# Patient Record
Sex: Male | Born: 1965 | Race: Black or African American | Hispanic: No | Smoking: Former smoker
Health system: Southern US, Community
[De-identification: ages and names within clinical notes are randomized; demographics above are authoritative.]

## PROBLEM LIST (undated history)

## (undated) DIAGNOSIS — F329 Major depressive disorder, single episode, unspecified: Secondary | ICD-10-CM

## (undated) DIAGNOSIS — I2721 Secondary pulmonary arterial hypertension: Secondary | ICD-10-CM

## (undated) DIAGNOSIS — F32A Depression, unspecified: Secondary | ICD-10-CM

## (undated) DIAGNOSIS — I2510 Atherosclerotic heart disease of native coronary artery without angina pectoris: Secondary | ICD-10-CM

## (undated) DIAGNOSIS — J439 Emphysema, unspecified: Secondary | ICD-10-CM

## (undated) DIAGNOSIS — F419 Anxiety disorder, unspecified: Secondary | ICD-10-CM

## (undated) HISTORY — DX: Major depressive disorder, single episode, unspecified: F32.9

## (undated) HISTORY — DX: Anxiety disorder, unspecified: F41.9

## (undated) HISTORY — DX: Depression, unspecified: F32.A

## (undated) HISTORY — PX: HERNIA REPAIR: SHX51

---

## 1998-01-29 ENCOUNTER — Emergency Department (HOSPITAL_COMMUNITY): Admission: EM | Admit: 1998-01-29 | Discharge: 1998-01-29 | Payer: Self-pay | Admitting: Emergency Medicine

## 1998-02-16 ENCOUNTER — Ambulatory Visit (HOSPITAL_BASED_OUTPATIENT_CLINIC_OR_DEPARTMENT_OTHER): Admission: RE | Admit: 1998-02-16 | Discharge: 1998-02-16 | Payer: Self-pay | Admitting: General Surgery

## 2005-11-22 ENCOUNTER — Emergency Department (HOSPITAL_COMMUNITY): Admission: EM | Admit: 2005-11-22 | Discharge: 2005-11-22 | Payer: Self-pay | Admitting: Emergency Medicine

## 2007-11-19 ENCOUNTER — Ambulatory Visit: Payer: Self-pay | Admitting: Family Medicine

## 2007-11-19 DIAGNOSIS — F17200 Nicotine dependence, unspecified, uncomplicated: Secondary | ICD-10-CM | POA: Insufficient documentation

## 2007-11-19 DIAGNOSIS — R634 Abnormal weight loss: Secondary | ICD-10-CM | POA: Insufficient documentation

## 2007-11-19 HISTORY — DX: Abnormal weight loss: R63.4

## 2007-11-19 LAB — CONVERTED CEMR LAB
Glucose, Urine, Semiquant: NEGATIVE
Urobilinogen, UA: 1
WBC Urine, dipstick: NEGATIVE

## 2007-11-20 ENCOUNTER — Encounter: Payer: Self-pay | Admitting: Family Medicine

## 2007-11-20 LAB — CONVERTED CEMR LAB
HCV Ab: NEGATIVE
Hep A IgM: NEGATIVE
Hep B C IgM: NEGATIVE

## 2007-11-21 ENCOUNTER — Encounter: Payer: Self-pay | Admitting: Family Medicine

## 2007-11-26 ENCOUNTER — Ambulatory Visit: Payer: Self-pay | Admitting: Family Medicine

## 2007-11-26 ENCOUNTER — Telehealth: Payer: Self-pay | Admitting: Family Medicine

## 2007-11-26 DIAGNOSIS — D729 Disorder of white blood cells, unspecified: Secondary | ICD-10-CM | POA: Insufficient documentation

## 2007-11-26 DIAGNOSIS — D72829 Elevated white blood cell count, unspecified: Secondary | ICD-10-CM | POA: Insufficient documentation

## 2007-11-26 LAB — CONVERTED CEMR LAB
ALT: 22 units/L (ref 0–53)
ALT: 22 units/L (ref 0–53)
AST: 27 units/L (ref 0–37)
Alkaline Phosphatase: 60 units/L (ref 39–117)
Basophils Absolute: 0.1 10*3/uL (ref 0.0–0.1)
Basophils Absolute: 0.1 10*3/uL (ref 0.0–0.1)
Basophils Relative: 0.8 % (ref 0.0–1.0)
Basophils Relative: 0.8 % (ref 0.0–1.0)
Bilirubin, Direct: 0.2 mg/dL (ref 0.0–0.3)
Bilirubin, Direct: 0.2 mg/dL (ref 0.0–0.3)
CO2: 31 meq/L (ref 19–32)
Calcium: 9.4 mg/dL (ref 8.4–10.5)
Chloride: 107 meq/L (ref 96–112)
Cholesterol: 135 mg/dL (ref 0–200)
Creatinine, Ser: 1.1 mg/dL (ref 0.4–1.5)
Creatinine, Ser: 1.1 mg/dL (ref 0.4–1.5)
Eosinophils Relative: 4.6 % (ref 0.0–5.0)
GFR calc Af Amer: 95 mL/min
HCT: 46.9 % (ref 39.0–52.0)
HCT: 42 % (ref 39.0–52.0)
Hemoglobin: 15.5 g/dL (ref 13.0–17.0)
Hemoglobin: 13.9 g/dL (ref 13.0–17.0)
LDL Cholesterol: 88 mg/dL (ref 0–99)
Lymphocytes Relative: 16.1 % (ref 12.0–46.0)
Lymphocytes Relative: 31.2 % (ref 12.0–46.0)
MCHC: 33.1 g/dL (ref 30.0–36.0)
MCHC: 33.1 g/dL (ref 30.0–36.0)
MCV: 92.7 fL (ref 78.0–100.0)
Monocytes Absolute: 1.1 10*3/uL — ABNORMAL HIGH (ref 0.1–1.0)
Monocytes Absolute: 0.6 10*3/uL (ref 0.1–1.0)
Monocytes Relative: 5.6 % (ref 3.0–12.0)
Neutro Abs: 11.1 10*3/uL — ABNORMAL HIGH (ref 1.4–7.7)
Neutrophils Relative %: 72.2 % (ref 43.0–77.0)
PSA: 0.33 ng/mL (ref 0.10–4.00)
PSA: 0.33 ng/mL (ref 0.10–4.00)
Platelets: 171 10*3/uL (ref 150–400)
Potassium: 4.5 meq/L (ref 3.5–5.1)
RBC: 5.07 M/uL (ref 4.22–5.81)
RBC: 5.07 M/uL (ref 4.22–5.81)
RDW: 13.8 % (ref 11.5–14.6)
RDW: 13.4 % (ref 11.5–14.6)
Sodium: 141 meq/L (ref 135–145)
TSH: 1.01 microintl units/mL (ref 0.35–5.50)
Total Bilirubin: 0.7 mg/dL (ref 0.3–1.2)
Total Bilirubin: 0.7 mg/dL (ref 0.3–1.2)
Total Protein: 7 g/dL (ref 6.0–8.3)
Triglycerides: 62 mg/dL (ref 0–149)
VLDL: 12 mg/dL (ref 0–40)
WBC: 15.4 10*3/uL — ABNORMAL HIGH (ref 4.5–10.5)
WBC: 11.2 10*3/uL — ABNORMAL HIGH (ref 4.5–10.5)

## 2007-12-03 ENCOUNTER — Ambulatory Visit: Payer: Self-pay | Admitting: Family Medicine

## 2007-12-24 ENCOUNTER — Ambulatory Visit: Payer: Self-pay | Admitting: Family Medicine

## 2007-12-24 DIAGNOSIS — G4720 Circadian rhythm sleep disorder, unspecified type: Secondary | ICD-10-CM

## 2007-12-24 HISTORY — DX: Circadian rhythm sleep disorder, unspecified type: G47.20

## 2008-01-14 ENCOUNTER — Ambulatory Visit: Payer: Self-pay | Admitting: Family Medicine

## 2008-01-14 DIAGNOSIS — L84 Corns and callosities: Secondary | ICD-10-CM

## 2009-02-25 ENCOUNTER — Ambulatory Visit: Payer: Self-pay | Admitting: Family Medicine

## 2009-02-25 LAB — CONVERTED CEMR LAB
AST: 36 units/L (ref 0–37)
BUN: 13 mg/dL (ref 6–23)
Basophils Absolute: 0.1 10*3/uL (ref 0.0–0.1)
Bilirubin Urine: NEGATIVE
Bilirubin, Direct: 0 mg/dL (ref 0.0–0.3)
Blood in Urine, dipstick: NEGATIVE
Calcium: 9 mg/dL (ref 8.4–10.5)
Cholesterol: 111 mg/dL (ref 0–200)
Creatinine, Ser: 1.1 mg/dL (ref 0.4–1.5)
Eosinophils Relative: 4.6 % (ref 0.0–5.0)
GFR calc non Af Amer: 94.35 mL/min (ref >60)
Glucose, Bld: 77 mg/dL (ref 70–99)
Glucose, Urine, Semiquant: NEGATIVE
HCT: 43 % (ref 39.0–52.0)
HDL: 40.3 mg/dL (ref >39.00)
Ketones, urine, test strip: NEGATIVE
LDL Cholesterol: 58 mg/dL (ref 0–99)
Lymphs Abs: 2.6 10*3/uL (ref 0.7–4.0)
Monocytes Relative: 8.1 % (ref 3.0–12.0)
Neutrophils Relative %: 62.7 % (ref 43.0–77.0)
PSA: 0.33 ng/mL (ref 0.10–4.00)
Platelets: 161 10*3/uL (ref 150.0–400.0)
Potassium: 4.2 meq/L (ref 3.5–5.1)
RDW: 13.8 % (ref 11.5–14.6)
TSH: 0.99 microintl units/mL (ref 0.35–5.50)
Total Bilirubin: 0.6 mg/dL (ref 0.3–1.2)
Triglycerides: 64 mg/dL (ref 0.0–149.0)
Urobilinogen, UA: 0.2
VLDL: 12.8 mg/dL (ref 0.0–40.0)
WBC: 11.3 10*3/uL — ABNORMAL HIGH (ref 4.5–10.5)
pH: 5.5

## 2009-03-04 ENCOUNTER — Ambulatory Visit: Payer: Self-pay | Admitting: Family Medicine

## 2009-03-04 DIAGNOSIS — K449 Diaphragmatic hernia without obstruction or gangrene: Secondary | ICD-10-CM | POA: Insufficient documentation

## 2009-04-05 ENCOUNTER — Telehealth: Payer: Self-pay | Admitting: Family Medicine

## 2009-05-19 ENCOUNTER — Ambulatory Visit: Payer: Self-pay | Admitting: Family Medicine

## 2009-05-19 DIAGNOSIS — M5030 Other cervical disc degeneration, unspecified cervical region: Secondary | ICD-10-CM

## 2009-05-26 ENCOUNTER — Ambulatory Visit: Payer: Self-pay | Admitting: Family Medicine

## 2011-07-28 ENCOUNTER — Emergency Department (HOSPITAL_COMMUNITY)
Admission: EM | Admit: 2011-07-28 | Discharge: 2011-07-28 | Disposition: A | Discharge status: Home / Self Care | Payer: Self-pay | Attending: Emergency Medicine | Admitting: Emergency Medicine

## 2011-07-28 ENCOUNTER — Encounter: Payer: Self-pay | Admitting: *Deleted

## 2011-07-28 ENCOUNTER — Emergency Department (HOSPITAL_COMMUNITY): Payer: Self-pay

## 2011-07-28 DIAGNOSIS — J189 Pneumonia, unspecified organism: Secondary | ICD-10-CM | POA: Insufficient documentation

## 2011-07-28 DIAGNOSIS — F17200 Nicotine dependence, unspecified, uncomplicated: Secondary | ICD-10-CM | POA: Insufficient documentation

## 2011-07-28 DIAGNOSIS — R0602 Shortness of breath: Secondary | ICD-10-CM | POA: Insufficient documentation

## 2011-07-28 DIAGNOSIS — IMO0001 Reserved for inherently not codable concepts without codable children: Secondary | ICD-10-CM | POA: Insufficient documentation

## 2011-07-28 DIAGNOSIS — R112 Nausea with vomiting, unspecified: Secondary | ICD-10-CM | POA: Insufficient documentation

## 2011-07-28 DIAGNOSIS — R05 Cough: Secondary | ICD-10-CM | POA: Insufficient documentation

## 2011-07-28 DIAGNOSIS — J3489 Other specified disorders of nose and nasal sinuses: Secondary | ICD-10-CM | POA: Insufficient documentation

## 2011-07-28 DIAGNOSIS — R6883 Chills (without fever): Secondary | ICD-10-CM | POA: Insufficient documentation

## 2011-07-28 DIAGNOSIS — R059 Cough, unspecified: Secondary | ICD-10-CM | POA: Insufficient documentation

## 2011-07-28 DIAGNOSIS — R109 Unspecified abdominal pain: Secondary | ICD-10-CM | POA: Insufficient documentation

## 2011-07-28 LAB — CBC
Hemoglobin: 16.5 g/dL (ref 13.0–17.0)
MCHC: 34.9 g/dL (ref 30.0–36.0)
RBC: 5.2 MIL/uL (ref 4.22–5.81)
WBC: 17.7 10*3/uL — ABNORMAL HIGH (ref 4.0–10.5)

## 2011-07-28 MED ORDER — SODIUM CHLORIDE 0.9 % IV BOLUS (SEPSIS)
1000.0000 mL | Freq: Once | INTRAVENOUS | Status: AC
  Administered 2011-07-28: 1000 mL via INTRAVENOUS

## 2011-07-28 MED ORDER — OXYCODONE-ACETAMINOPHEN 5-325 MG PO TABS: 1.0000 | ORAL_TABLET | ORAL | Status: AC | PRN

## 2011-07-28 MED ORDER — AZITHROMYCIN 250 MG PO TABS: 250.0000 mg | ORAL_TABLET | Freq: Every day | ORAL | Status: AC

## 2011-07-28 MED ORDER — ONDANSETRON HCL 4 MG/2ML IJ SOLN
INTRAMUSCULAR | Status: AC
  Administered 2011-07-28: 4 mg via INTRAVENOUS
  Filled 2011-07-28: qty 2

## 2011-07-28 MED ORDER — ONDANSETRON 8 MG PO TBDP: 8.0000 mg | ORAL_TABLET | Freq: Three times a day (TID) | ORAL | Status: AC | PRN

## 2011-07-28 MED ORDER — ALBUTEROL SULFATE HFA 108 (90 BASE) MCG/ACT IN AERS
2.0000 | INHALATION_SPRAY | RESPIRATORY_TRACT | Status: DC | PRN
  Filled 2011-07-28: qty 6.7

## 2011-07-28 MED ORDER — ONDANSETRON HCL 4 MG/2ML IJ SOLN
4.0000 mg | Freq: Once | INTRAMUSCULAR | Status: AC
  Administered 2011-07-28: 4 mg via INTRAVENOUS
  Filled 2011-07-28: qty 2

## 2011-07-28 MED ORDER — ONDANSETRON HCL 4 MG/2ML IJ SOLN
4.0000 mg | Freq: Once | INTRAMUSCULAR | Status: AC
  Administered 2011-07-28: 4 mg via INTRAVENOUS

## 2011-07-28 NOTE — ED Notes (Signed)
Pt is feeling better. Able to keep down sprite. Inhaler given for home use. Explained to pt to follow bland diet for next couple days and drink plenty of fluids. Pt with ride home from family.

## 2011-07-28 NOTE — ED Provider Notes (Signed)
History     CSN: [id number] Arrival date & time: 07/28/2011  1:43 PM   First MD Initiated Contact with Patient 07/28/11 1605      No chief complaint on file.   (Consider location/radiation/quality/duration/timing/severity/associated sxs/prior treatment) The history is provided by the patient.   patient has a cough and cold symptoms since Monday. He's had a cough with yellow sputum it is also had bloody sputum. he has had nausea and vomiting and has been unable to eat do to it. Some chills without clear fever. Some mild myalgias. He states he just feels bad all over. He states he was helping out at a baseball game and some of the people there were similar in sick. He is overall healthy. He does smoke. He states he has not smoked in the last week. He states that he has had some bleeding when he brushes his teeth, which is unusual for him.  History reviewed. No pertinent past medical history.  Past Surgical History  Procedure Date  . Hernia repair     No family history on file.  History  Substance Use Topics  . Smoking status: Current Everyday Smoker  . Smokeless tobacco: Not on file  . Alcohol Use: Yes      Review of Systems  Constitutional: Positive for chills, appetite change and fatigue.  HENT: Positive for congestion. Negative for neck pain.   Respiratory: Positive for cough and shortness of breath.   Cardiovascular: Negative for chest pain.  Gastrointestinal: Positive for nausea, vomiting and abdominal pain.  Genitourinary: Negative for flank pain.  Musculoskeletal: Positive for myalgias.  Neurological: Negative for weakness and numbness.  Hematological:       Patient states he has had bleeding when brushing his teeth.  Psychiatric/Behavioral: Negative for confusion.    Allergies  Review of patient's allergies indicates no known allergies.  Home Medications   Current Outpatient Rx  Name Route Sig Dispense Refill  . MUCINEX PO Oral Take 20 mLs by mouth every 4  (four) hours as needed. For congestion     . AZITHROMYCIN 250 MG PO TABS Oral Take 1 tablet (250 mg total) by mouth daily. Take first 2 tablets together, then 1 every day until finished. 6 tablet 0  . ONDANSETRON 8 MG PO TBDP Oral Take 1 tablet (8 mg total) by mouth every 8 (eight) hours as needed for nausea. 20 tablet 0  . OXYCODONE-ACETAMINOPHEN 5-325 MG PO TABS Oral Take 1 tablet by mouth every 4 (four) hours as needed for pain. 15 tablet 0    BP 125/82  Pulse 63  Temp(Src) 98.9 F (37.2 C) (Oral)  Resp 15  SpO2 96%  Physical Exam  Nursing note and vitals reviewed. Constitutional: He is oriented to person, place, and time. He appears well-developed and well-nourished.  HENT:  Head: Normocephalic and atraumatic.  Eyes: EOM are normal. Pupils are equal, round, and reactive to light.  Neck: Normal range of motion. Neck supple.  Cardiovascular: Normal rate, regular rhythm and normal heart sounds.   No murmur heard. Pulmonary/Chest: Effort normal. He has wheezes.       Diffuse wheezes and prolonged expirations. Increased wheezes and a few scattered rails and left midlung field.  Abdominal: Soft. Bowel sounds are normal. He exhibits no distension and no mass. There is no tenderness. There is no rebound and no guarding.  Musculoskeletal: Normal range of motion. He exhibits no edema.  Neurological: He is alert and oriented to person, place, and time. No cranial nerve  deficit.  Skin: Skin is warm and dry.  Psychiatric: He has a normal mood and affect.    ED Course  Procedures (including critical care time)  Labs Reviewed  CBC - Abnormal; Notable for the following:    WBC 17.7 (*)    All other components within normal limits   Dg Chest 2 View  07/28/2011  *RADIOLOGY REPORT*  Clinical Data: Shortness of breath.  Cough.  CHEST - 2 VIEW  Comparison: None.  Findings: Central peribronchial thickening is noted.  No evidence of pulmonary infiltrate or edema.  No evidence of pleural  effusion. Heart size is normal.  No mass or lymphadenopathy identified. Visualized skeletal structures are unremarkable.  IMPRESSION: No active cardiopulmonary disease.  Original Report Authenticated By: Danae Orleans, M.D.     1. CAP (community acquired pneumonia)       MDM  Cough URI symptoms nausea vomiting. Patient has focal findings on lung examination. I will treat him as a pneumonia despite his negative x-ray. His been feeling better after some treatment here. His white count is elevated but would be explained by infection. He has tolerated orals although he has had some nauseousness. A little vomiting. He'll be discharged home with antibiotics pain meds and antiemetics        Juliet Rude. Rubin Payor, MD 07/28/11 [phone]

## 2011-07-28 NOTE — ED Notes (Signed)
Pt reports he is feeling better.

## 2011-07-28 NOTE — ED Notes (Signed)
Pt with episode of nausea and vomiting. Pt given hand towel for face. Will update edp.

## 2011-07-28 NOTE — ED Notes (Signed)
Pt reports he is nauseas again with mild vomiting. meds ordered.

## 2011-07-28 NOTE — ED Notes (Addendum)
Pt reports bodyaches, productive cough, ?fevers since Sunday. Pt reports he has had nausea and vomiting for several days. No specific abdominal discomfort. Pts lungs coarse. Pt reports blood tinged sputum last night. No hx of clotting disorder. Pt is current smoker.

## 2011-07-28 NOTE — ED Notes (Signed)
Patient reports onset of cold sx on Monday.  He has tried otc tx w/o relief. He has not been able to eat due to n/v.  Patient states on yesterday he started spitting up yellow colored sputum.  He states he had bloody sputum today.  Patient states he was in [employer] 2 weeks ago

## 2014-06-30 ENCOUNTER — Ambulatory Visit (INDEPENDENT_AMBULATORY_CARE_PROVIDER_SITE_OTHER): Payer: Self-pay | Admitting: Family Medicine

## 2014-06-30 ENCOUNTER — Ambulatory Visit (INDEPENDENT_AMBULATORY_CARE_PROVIDER_SITE_OTHER): Payer: BC Managed Care – PPO | Admitting: Family Medicine

## 2014-06-30 ENCOUNTER — Encounter: Payer: Self-pay | Admitting: Family Medicine

## 2014-06-30 VITALS — BP 122/80 | HR 73 | Temp 98.1°F | Resp 16 | Ht 73.0 in | Wt 184.0 lb

## 2014-06-30 DIAGNOSIS — R350 Frequency of micturition: Secondary | ICD-10-CM

## 2014-06-30 DIAGNOSIS — Z6824 Body mass index (BMI) 24.0-24.9, adult: Secondary | ICD-10-CM

## 2014-06-30 DIAGNOSIS — F411 Generalized anxiety disorder: Secondary | ICD-10-CM

## 2014-06-30 DIAGNOSIS — R112 Nausea with vomiting, unspecified: Secondary | ICD-10-CM

## 2014-06-30 LAB — CBC WITH DIFFERENTIAL/PLATELET
BASOS PCT: 1 % (ref 0–1)
Basophils Absolute: 0.1 10*3/uL (ref 0.0–0.1)
EOS ABS: 0.4 10*3/uL (ref 0.0–0.7)
EOS PCT: 4 % (ref 0–5)
HCT: 42.7 % (ref 39.0–52.0)
HEMOGLOBIN: 14.5 g/dL (ref 13.0–17.0)
LYMPHS ABS: 3 10*3/uL (ref 0.7–4.0)
Lymphocytes Relative: 28 % (ref 12–46)
MCH: 30.7 pg (ref 26.0–34.0)
MCHC: 34 g/dL (ref 30.0–36.0)
MCV: 90.3 fL (ref 78.0–100.0)
MONO ABS: 0.9 10*3/uL (ref 0.1–1.0)
MONOS PCT: 8 % (ref 3–12)
Neutro Abs: 6.3 10*3/uL (ref 1.7–7.7)
Neutrophils Relative %: 59 % (ref 43–77)
Platelets: 204 10*3/uL (ref 150–400)
RBC: 4.73 MIL/uL (ref 4.22–5.81)
RDW: 14.1 % (ref 11.5–15.5)
WBC: 10.7 10*3/uL — ABNORMAL HIGH (ref 4.0–10.5)

## 2014-06-30 LAB — COMPLETE METABOLIC PANEL WITH GFR
ALBUMIN: 3.8 g/dL (ref 3.5–5.2)
ALT: 13 U/L (ref 0–53)
AST: 20 U/L (ref 0–37)
Alkaline Phosphatase: 62 U/L (ref 39–117)
BUN: 9 mg/dL (ref 6–23)
CALCIUM: 9 mg/dL (ref 8.4–10.5)
CHLORIDE: 103 meq/L (ref 96–112)
CO2: 26 mEq/L (ref 19–32)
Creat: 1.22 mg/dL (ref 0.50–1.35)
GFR, Est African American: 81 mL/min
GFR, Est Non African American: 70 mL/min
Glucose, Bld: 78 mg/dL (ref 70–99)
POTASSIUM: 4.2 meq/L (ref 3.5–5.3)
SODIUM: 136 meq/L (ref 135–145)
Total Bilirubin: 0.4 mg/dL (ref 0.2–1.2)
Total Protein: 6.7 g/dL (ref 6.0–8.3)

## 2014-06-30 LAB — POCT UA - MICROSCOPIC ONLY
CASTS, UR, LPF, POC: NEGATIVE
CRYSTALS, UR, HPF, POC: NEGATIVE
EPITHELIAL CELLS, URINE PER MICROSCOPY: 0.3
Mucus, UA: NEGATIVE
SPERM: POSITIVE
Yeast, UA: NEGATIVE

## 2014-06-30 LAB — POCT URINALYSIS DIPSTICK
GLUCOSE UA: NEGATIVE
Leukocytes, UA: NEGATIVE
NITRITE UA: NEGATIVE
PH UA: 5.5
SPEC GRAV UA: 1.02
UROBILINOGEN UA: 1

## 2014-06-30 NOTE — Patient Instructions (Signed)
Please call one of the following counselors and make an appointment-  Vernell LeepKen Frazier [phone] or Karmen BongoAaron Stewart [phone]  Generalized Anxiety Disorder Generalized anxiety disorder (GAD) is a mental disorder. It interferes with life functions, including relationships, work, and school. GAD is different from normal anxiety, which everyone experiences at some point in their lives in response to specific life events and activities. Normal anxiety actually helps us prepare for and get through these life events and activities. Normal anxiety goes away after the event or activity is over.  GAD causes anxiety that is not necessarily related to specific events or activities. It also causes excess anxiety in proportion to specific events or activities. The anxiety associated with GAD is also difficult to control. GAD can vary from mild to severe. People with severe GAD can have intense waves of anxiety with physical symptoms (panic attacks).  SYMPTOMS The anxiety and worry associated with GAD are difficult to control. This anxiety and worry are related to many life events and activities and also occur more days than not for 6 months or longer. People with GAD also have three or more of the following symptoms (one or more in children):  Restlessness.   Fatigue.  Difficulty concentrating.   Irritability.  Muscle tension.  Difficulty sleeping or unsatisfying sleep. DIAGNOSIS GAD is diagnosed through an assessment by your health care provider. Your health care provider will ask you questions aboutyour mood,physical symptoms, and events in your life. Your health care provider may ask you about your medical history and use of alcohol or drugs, including prescription medicines. Your health care provider may also do a physical exam and blood tests. Certain medical conditions and the use of certain substances can cause symptoms similar to those associated with GAD. Your health care provider may refer you to a  mental health specialist for further evaluation. TREATMENT The following therapies are usually used to treat GAD:   Medication. Antidepressant medication usually is prescribed for long-term daily control. Antianxiety medicines may be added in severe cases, especially when panic attacks occur.   Talk therapy (psychotherapy). Certain types of talk therapy can be helpful in treating GAD by providing support, education, and guidance. A form of talk therapy called cognitive behavioral therapy can teach you healthy ways to think about and react to daily life events and activities.  Stress managementtechniques. These include yoga, meditation, and exercise and can be very helpful when they are practiced regularly. A mental health specialist can help determine which treatment is best for you. Some people see improvement with one therapy. However, other people require a combination of therapies. Document Released: 12/02/2012 Document Revised: 12/22/2013 Document Reviewed: 12/02/2012 [facility] Patient Information 2015 [employer] [place]. This information is not intended to replace advice given to you by your health care provider. Make sure you discuss any questions you have with your health care provider.

## 2014-06-30 NOTE — Progress Notes (Signed)
   Subjective:    Patient ID: Timothy Hernandez, male    DOB: 01/06/1965, 48 y.o.   MRN: [id number]  HPI This encounter was erroneously created. The patient was found to have a duplicate chart.   Review of Systems     Objective:   Physical Exam        Assessment & Plan:

## 2014-06-30 NOTE — Progress Notes (Signed)
Subjective:    Patient ID: Timothy Sheffield Phariss Sr., male    DOB: 04-01-1966, 48 y.o.   MRN: [id number]  HPI This is a 48 yo male who presents today to establish care and to address a variety of issues. His girlfriend insisted he have a check up.   Urinary frequency: Patient presents today with intermittent urinary frequency for 4 months. Sometimes he urinates every hour at night. He has decreased his night time fluid and caffeine with some relief. He does not have difficulty starting his stream, he does not have difficulty fully emptying his bladder. He has no incontinence. His libido and sexual performance have not decreased. He is currently in a relationship that he has been in for 1 year. This is the only relationship he has had since his divorce 3-4 years ago. He was tested for STIs around the time of his divorce. Had chlamydia in 1988.   Nausea and vomiting: He has history of intermittent nausea and vomiting for many years. It is brought on by stretching and exercise. He can avoid the symptoms if he avoids stretching and exercise. This seems to have started following his umbilical hernia repair. He was told it may be related to the mesh used in his hernia repair irritating his internal organs. The surgeon who repaired his hernia has retired. He is not sure of his name or the practice name.   He has a long standing history of anxiety and depression. He has taken xanax and celexa in the past but has not taken in a long time. He tries to handle his anxiety with positive self talk and relaxation exercises. He prefers not to take medication. His anxiety regularly interferes with his ability to do the things he would like to do- move his son to college, accompany his daughter places. He denies suicidal or homicidal ideation.   Has history of asthma, no symptoms for more than 10 years. He has really cut back on his exercise since it exacerbates his nausea and vomiting.    Past Medical History    Diagnosis Date  . Depression    Past Surgical History  Procedure Laterality Date  . Hernia repair     Family History  Problem Relation Age of Onset  . Cancer Mother     multiple myeloma, has had 2 stem cell transplants.  . Cancer Maternal Grandmother   . Cancer Maternal Grandfather     lung   History  Substance Use Topics  . Smoking status: Current Every Day Smoker  . Smokeless tobacco: Not on file  . Alcohol Use: 0.0 oz/week    0 Not specified per week      Review of Systems Some polydipsia, no polyphagia, occasional chills at night, no fever, intermittent dysuria, no penile discharge, no penile itching. No diarrhea, no constipation.       Objective:   Physical Exam  Constitutional: He appears well-developed and well-nourished. No distress.  HENT:  Head: Normocephalic and atraumatic.  Right Ear: Tympanic membrane, external ear and ear canal normal.  Left Ear: Tympanic membrane and ear canal normal.  Eyes: Conjunctivae are normal. Pupils are equal, round, and reactive to light.  Neck: Normal range of motion. Neck supple.  Cardiovascular: Normal rate, regular rhythm and normal heart sounds.   Pulmonary/Chest: Effort normal and breath sounds normal.  Abdominal: Soft. Bowel sounds are normal. He exhibits no distension and no mass. There is no tenderness. There is no rebound and no guarding.  Lymphadenopathy:    He has no cervical adenopathy.  Skin: He is not diaphoretic.  Vitals reviewed.  BP 122/80 mmHg  Pulse 73  Temp(Src) 98.1 F (36.7 C)  Resp 16  Ht 6' 1"  (1.854 m)  Wt 184 lb (83.462 kg)  BMI 24.28 kg/m2  SpO2 100%   POCT urinalysis dipstick  Result Value Ref Range   Color, UA yellow    Clarity, UA clear    Glucose, UA neg    Bilirubin, UA small    Ketones, UA trace    Spec Grav, UA 1.020    Blood, UA trace    pH, UA 5.5    Protein, UA trace    Urobilinogen, UA 1.0    Nitrite, UA neg    Leukocytes, UA Negative   POCT UA - Microscopic Only   Result Value Ref Range   WBC, Ur, HPF, POC 0-1    RBC, urine, microscopic 1-2    Bacteria, U Microscopic 1+    Mucus, UA neg    Epithelial cells, urine per micros 0.3    Crystals, Ur, HPF, POC neg    Casts, Ur, LPF, POC neg    Yeast, UA neg    Sperm pos        Assessment & Plan:  1. Body mass index (BMI) of 24.0-24.9 in adult  2. Urinary frequency - POCT urinalysis dipstick - POCT UA - Microscopic Only -dip and micro with only bacteria, will await other tests - GC/chlamydia probe amp, urine  3. Non-intractable vomiting with nausea, vomiting of unspecified type - HIV antibody - RPR - CBC with Differential - COMPLETE METABOLIC PANEL WITH GFR -Refer to general surgery to evaluate hernia repair site  4. Generalized anxiety disorder -Provided written and verbal information regarding diagnosis and treatment. -Provided the names and phone numbers of two therapists. Patient states he is willing to go to counseling.   Elby Beck, FNP-BC  [facility]  07/02/2014 9:21 PM

## 2014-07-01 LAB — RPR

## 2014-07-01 LAB — GC/CHLAMYDIA PROBE AMP, URINE
Chlamydia, Swab/Urine, PCR: NEGATIVE
GC Probe Amp, Urine: NEGATIVE

## 2014-07-01 LAB — HIV ANTIBODY (ROUTINE TESTING W REFLEX): HIV: NONREACTIVE

## 2014-07-06 ENCOUNTER — Ambulatory Visit (INDEPENDENT_AMBULATORY_CARE_PROVIDER_SITE_OTHER): Payer: BC Managed Care – PPO | Admitting: Family Medicine

## 2014-07-06 ENCOUNTER — Ambulatory Visit (INDEPENDENT_AMBULATORY_CARE_PROVIDER_SITE_OTHER): Payer: BC Managed Care – PPO

## 2014-07-06 VITALS — BP 122/70 | HR 55 | Temp 98.1°F | Resp 16 | Ht 73.25 in | Wt 183.4 lb

## 2014-07-06 DIAGNOSIS — M546 Pain in thoracic spine: Secondary | ICD-10-CM

## 2014-07-06 DIAGNOSIS — R350 Frequency of micturition: Secondary | ICD-10-CM

## 2014-07-06 MED ORDER — CYCLOBENZAPRINE HCL 10 MG PO TABS: 10.0000 mg | ORAL_TABLET | Freq: Three times a day (TID) | ORAL | Status: DC | PRN

## 2014-07-06 NOTE — Patient Instructions (Signed)
It appears that you have a muscle strain in your back. Use the flexeril as needed- remember it can make you sleepy Take it easy but be sure to move around frequently so you don't get stiff Let me know if you do not feel better in the next few days-.Sooner if worse.

## 2014-07-06 NOTE — Progress Notes (Signed)
[facility] [address] [place] [address] [phone]  Date:  07/06/2014   Name:  Timothy NOBOA Sr.   DOB:  1965/09/26   MRN:  [id number]  PCP:  Elby Beck, FNP    Chief Complaint: Back Pain   History of Present Illness:  Timothy Sheffield Murrill Sr. is a 48 y.o. very pleasant male patient who presents with the following:  He was doing some yard work today- he bent down and had sudden onset of pain in his back. He could not straighten up at first.  He is now able to walk, but continues to have pain.  He is trying to stretch to help with this pain No radiation into his legs- no numbness, weakness or pain.  No incontinence of bowel or bladder.   He is generally OW healthy;  He has had some occasional issues like this with his back in the past, had some disc issues in his neck.  Never had any surgery on his spine Here today with his GF  Also discussed his labs from when he was seen a week ago by Tor Netters; printed out labs and her note to him.  He would like to go ahead and see urology for his urinary frequency.    Patient Active Problem List   Diagnosis Date Noted  . [place] DISEASE, CERVICAL 05/19/2009  . HIATAL HERNIA WITH REFLUX 03/04/2009  . CORNS AND CALLOSITIES 01/14/2008  . DYSFNCT ASSO W/SLEEP STGES/AROUSAL FRM SLEEP 12/24/2007  . LEUKOCYTES, ABNORMAL 11/26/2007  . TOBACCO USER 11/19/2007  . WEIGHT LOSS, ABNORMAL 11/19/2007    Past Medical History  Diagnosis Date  . Depression     Past Surgical History  Procedure Laterality Date  . Hernia repair      History  Substance Use Topics  . Smoking status: Current Some Day Smoker  . Smokeless tobacco: Not on file  . Alcohol Use: 0.0 oz/week    0 Not specified per week    Family History  Problem Relation Age of Onset  . Cancer Mother     multiple myeloma, has had 2 stem cell transplants.  . Cancer Maternal Grandmother   . Cancer Maternal Grandfather     lung    No Known  Allergies  Medication list has been reviewed and updated.  No current outpatient prescriptions on file prior to visit.   No current facility-administered medications on file prior to visit.    Review of Systems:  As per HPI- otherwise negative.   Physical Examination: Filed Vitals:   07/06/14 1341  BP: 122/70  Pulse: 55  Temp: 98.1 F (36.7 C)  Resp: 16   Filed Vitals:   07/06/14 1341  Height: 6' 1.25" (1.861 m)  Weight: 183 lb 6.4 oz (83.19 kg)   Body mass index is 24.02 kg/(m^2). Ideal Body Weight: Weight in (lb) to have BMI = 25: 190.4  GEN: WDWN, NAD, Non-toxic, A & O x 3, looks well HEENT: Atraumatic, Normocephalic. Neck supple. No masses, No LAD. Ears and Nose: No external deformity. CV: RRR, No M/G/R. No JVD. No thrill. No extra heart sounds. PULM: CTA B, no wheezes, crackles, rhonchi. No retractions. No resp. distress. No accessory muscle use. EXTR: No c/c/e NEURO Normal gait.  PSYCH: Normally interactive. Conversant. Not depressed or anxious appearing.  Calm demeanor.  Tenderness over thoracic spine- both bony and muscular.  Normal strength, sensation and DTR in all extremities.  Negative SLR bilaterally  He has  normal flexion and extension of his back  [facility] reading (PRIMARY) by  Dr. Lorelei Pont. T spine: negative THORACIC SPINE - 2 VIEW  COMPARISON: Chest radiograph 11/19/2007.  FINDINGS: Very mild levoconvex curvature of the upper thoracic spine, stable. Alignment is otherwise anatomic. Vertebral body and disc space height are maintained. Mild scattered endplate degenerative changes in the mid and lower thoracic spine. Cervicothoracic junction is in alignment.  IMPRESSION: Mild spondylosis. No acute findings.  Assessment and Plan: Midline thoracic back pain - Plan: DG Thoracic Spine 2 View, cyclobenzaprine (FLEXERIL) 10 MG tablet  Urinary frequency - Plan: Ambulatory referral to Urology  Thoracic back strain.  No disc signs at at this time.   Flexeril as needed- cautioned regarding sedation Referral to urology placed   Signed Lamar Blinks, MD

## 2014-07-08 ENCOUNTER — Encounter: Payer: Self-pay | Admitting: Radiology

## 2016-02-28 IMAGING — CR DG THORACIC SPINE 2V
4 series · 4 of 4 positions shown · non-contrast
Comparison: Chest radiograph 11/19/2007.

CLINICAL DATA: Midline thoracic spine pain.

EXAM:
THORACIC SPINE - 2 VIEW

[AP (1 of 2)]
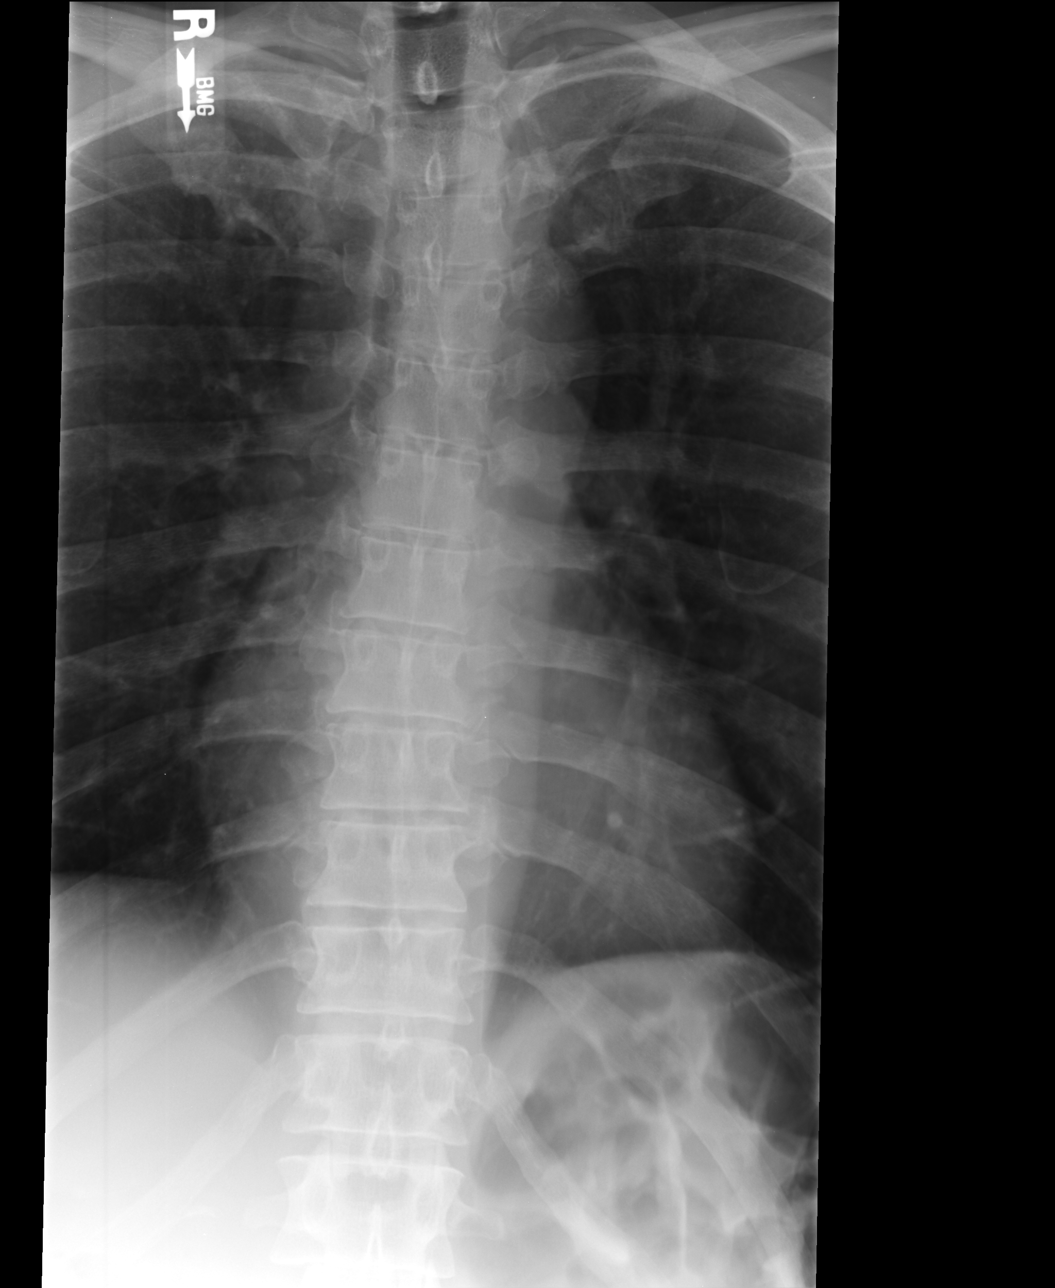

[lateral]
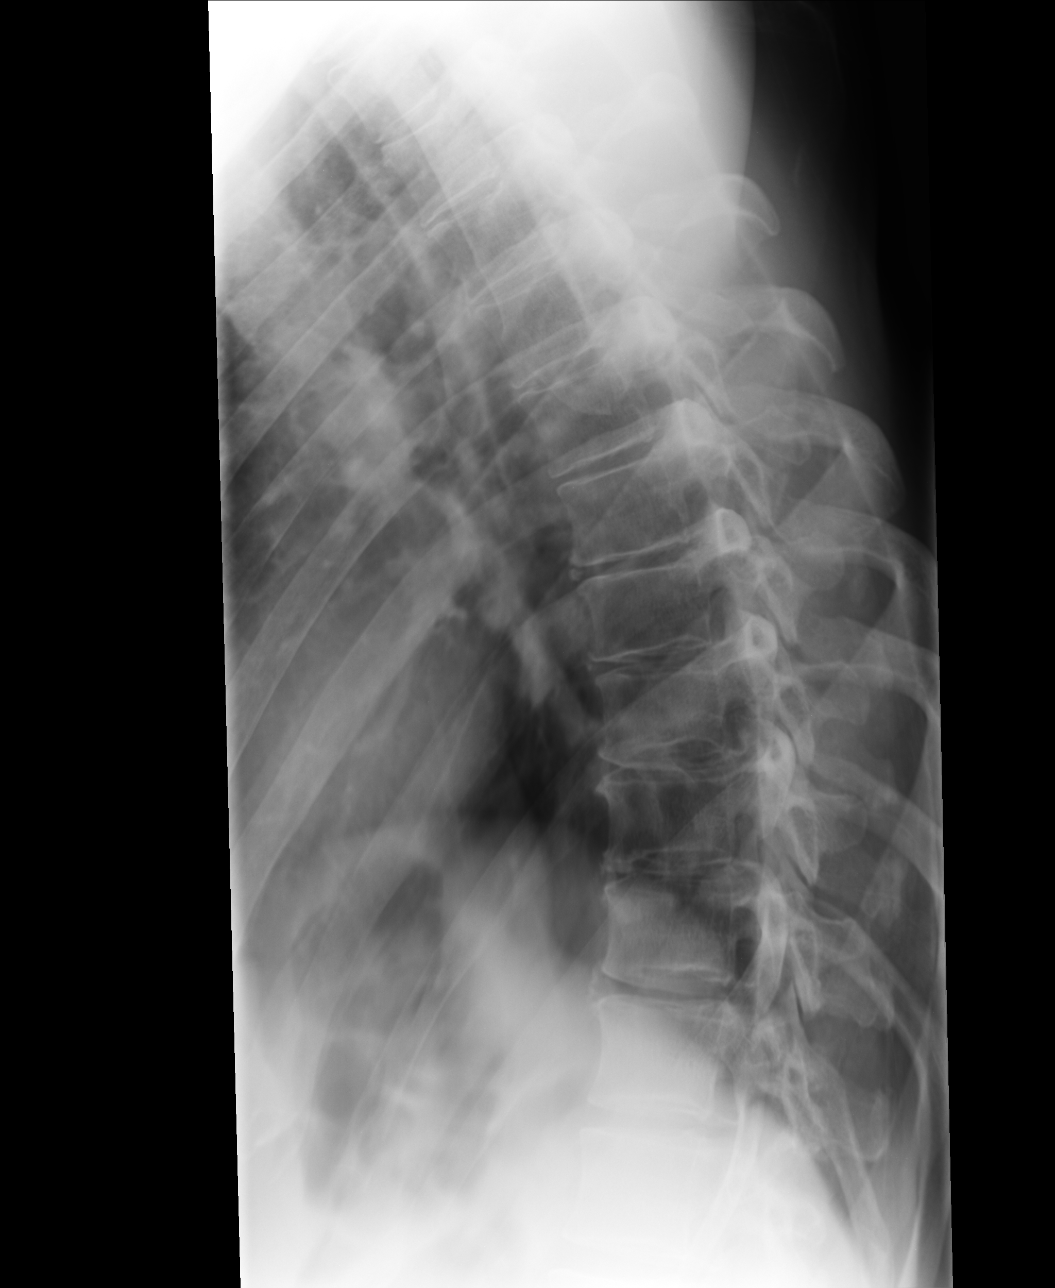

[swimmers]
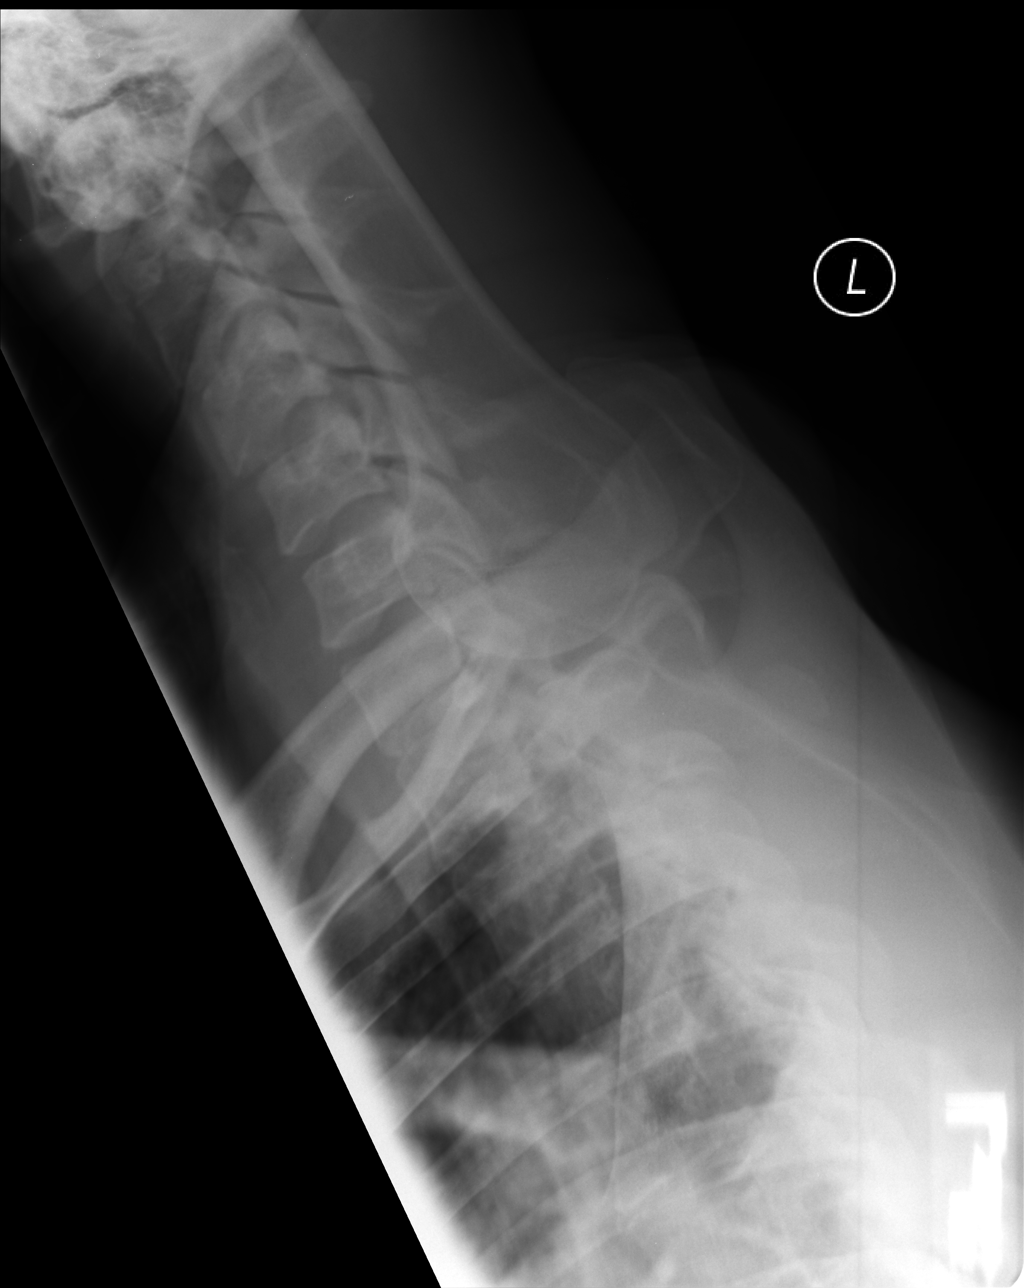

[AP (2 of 2)]
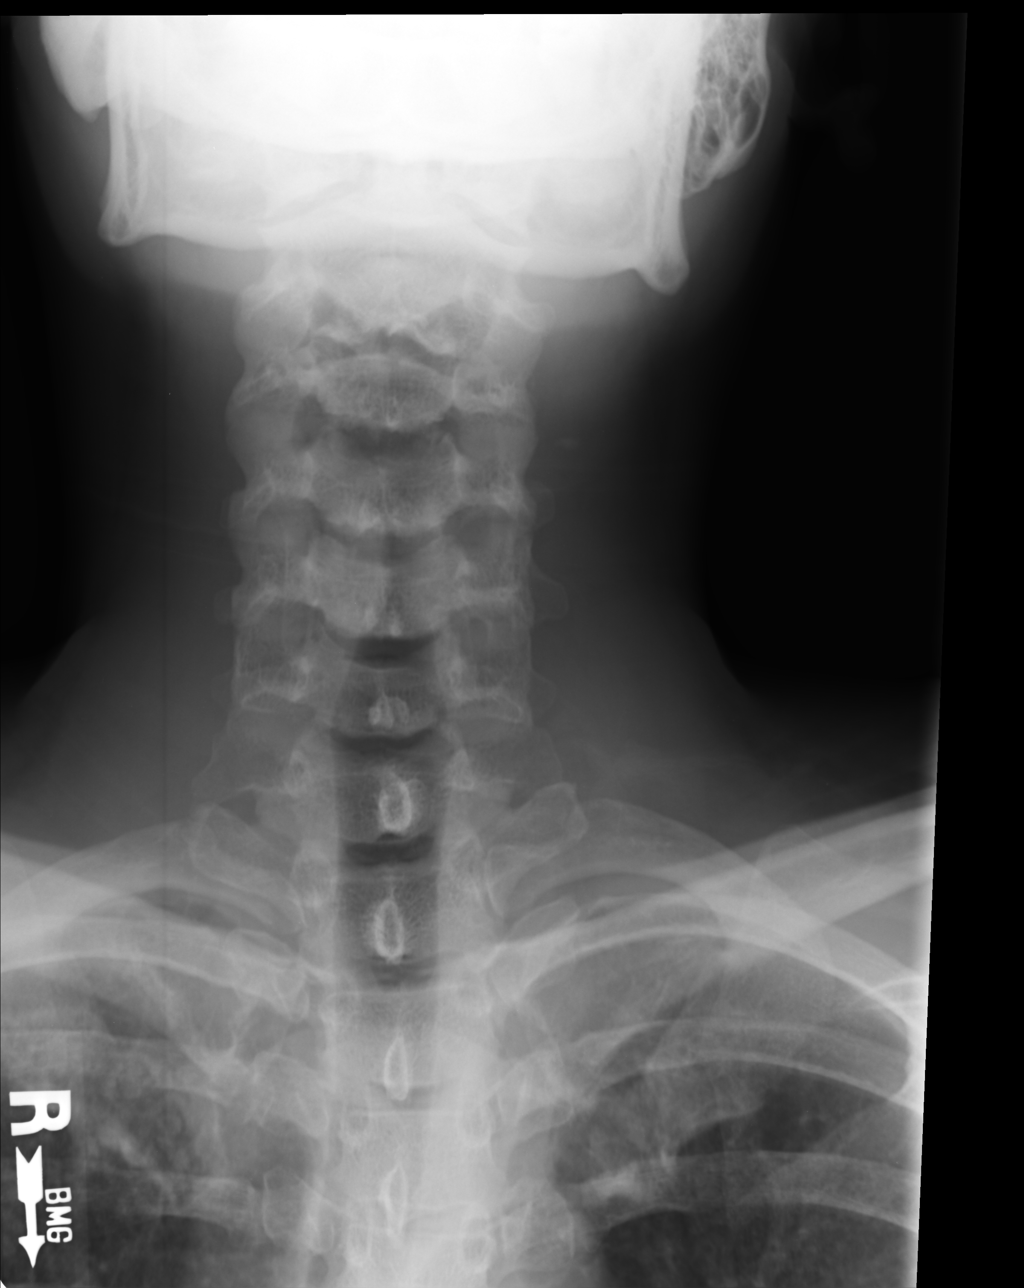

[4 of 4 positions shown; findings below may reference images not displayed]

FINDINGS: Very mild levoconvex curvature of the upper thoracic spine, stable.
Alignment is otherwise anatomic. Vertebral body and disc space
height are maintained. Mild scattered endplate degenerative changes
in the mid and lower thoracic spine. Cervicothoracic junction is in
alignment.
IMPRESSION: Mild spondylosis.  No acute findings.

## 2019-11-13 ENCOUNTER — Ambulatory Visit: Payer: Self-pay | Attending: Internal Medicine

## 2019-11-13 ENCOUNTER — Ambulatory Visit: Payer: Self-pay

## 2019-11-13 DIAGNOSIS — Z23 Encounter for immunization: Secondary | ICD-10-CM

## 2019-11-13 NOTE — Progress Notes (Signed)
   [id number]  Name:  Timothy Domangue Wurzel Sr.    MRN: [id number] DOB: Feb 22, 1966  11/13/2019  Timothy Hernandez was observed post Covid-19 immunization for 15 minutes without incident. He was provided with Vaccine Information Sheet and instruction to access the V-Safe system.   Timothy Hernandez was instructed to call 911 with any severe reactions post vaccine: Marland Kitchen Difficulty breathing  . Swelling of face and throat  . A fast heartbeat  . A bad rash all over body  . Dizziness and weakness   Immunizations Administered    Name Date Dose VIS Date Route   Pfizer COVID-19 Vaccine 11/13/2019  9:47 AM 0.3 mL 08/01/2019 Intramuscular   Manufacturer: [employer], [employer]   Lot: [id number]   NDC: [address]-9

## 2019-12-08 ENCOUNTER — Ambulatory Visit: Payer: Self-pay | Attending: Internal Medicine

## 2019-12-08 DIAGNOSIS — Z23 Encounter for immunization: Secondary | ICD-10-CM

## 2019-12-08 NOTE — Progress Notes (Signed)
   [facility]  Name:  Timothy Sanzo Glastetter Sr.    MRN: [id number] DOB: Dec 29, 1965  12/08/2019  Timothy Hernandez was observed post Covid-19 immunization for 15 minutes without incident. He was provided with Vaccine Information Sheet and instruction to access the V-Safe system.   Timothy Hernandez was instructed to call 911 with any severe reactions post vaccine: Marland Kitchen Difficulty breathing  . Swelling of face and throat  . A fast heartbeat  . A bad rash all over body  . Dizziness and weakness   Immunizations Administered    Name Date Dose VIS Date Route   Pfizer COVID-19 Vaccine 12/08/2019  9:25 AM 0.3 mL 10/15/2018 Intramuscular   Manufacturer: [employer], [employer]   Lot: [id number]   NDC: [address]-7

## 2023-05-26 ENCOUNTER — Encounter (HOSPITAL_BASED_OUTPATIENT_CLINIC_OR_DEPARTMENT_OTHER): Payer: Self-pay

## 2023-05-26 ENCOUNTER — Other Ambulatory Visit: Payer: Self-pay

## 2023-05-26 ENCOUNTER — Emergency Department (HOSPITAL_BASED_OUTPATIENT_CLINIC_OR_DEPARTMENT_OTHER)
Admission: EM | Admit: 2023-05-26 | Discharge: 2023-05-26 | Disposition: A | Discharge status: Home / Self Care | Payer: 59

## 2023-05-26 ENCOUNTER — Emergency Department (HOSPITAL_BASED_OUTPATIENT_CLINIC_OR_DEPARTMENT_OTHER): Payer: 59

## 2023-05-26 DIAGNOSIS — D72829 Elevated white blood cell count, unspecified: Secondary | ICD-10-CM | POA: Diagnosis not present

## 2023-05-26 DIAGNOSIS — R591 Generalized enlarged lymph nodes: Secondary | ICD-10-CM

## 2023-05-26 DIAGNOSIS — K5900 Constipation, unspecified: Secondary | ICD-10-CM

## 2023-05-26 DIAGNOSIS — R112 Nausea with vomiting, unspecified: Secondary | ICD-10-CM

## 2023-05-26 DIAGNOSIS — R109 Unspecified abdominal pain: Secondary | ICD-10-CM | POA: Diagnosis present

## 2023-05-26 LAB — URINALYSIS, ROUTINE W REFLEX MICROSCOPIC
Bacteria, UA: NONE SEEN
Bilirubin Urine: NEGATIVE
Glucose, UA: NEGATIVE mg/dL
Ketones, ur: 15 mg/dL — AB
Leukocytes,Ua: NEGATIVE
Nitrite: NEGATIVE
Protein, ur: 30 mg/dL — AB
Specific Gravity, Urine: 1.035 — ABNORMAL HIGH (ref 1.005–1.030)
pH: 6 (ref 5.0–8.0)

## 2023-05-26 LAB — COMPREHENSIVE METABOLIC PANEL
ALT: 8 U/L (ref 0–44)
AST: 15 U/L (ref 15–41)
Albumin: 4.3 g/dL (ref 3.5–5.0)
Alkaline Phosphatase: 85 U/L (ref 38–126)
Anion gap: 12 (ref 5–15)
BUN: 11 mg/dL (ref 6–20)
CO2: 24 mmol/L (ref 22–32)
Calcium: 9.7 mg/dL (ref 8.9–10.3)
Chloride: 100 mmol/L (ref 98–111)
Creatinine, Ser: 1.08 mg/dL (ref 0.61–1.24)
GFR, Estimated: >60 mL/min (ref >60)
Glucose, Bld: 111 mg/dL — ABNORMAL HIGH (ref 70–99)
Potassium: 3.6 mmol/L (ref 3.5–5.1)
Sodium: 136 mmol/L (ref 135–145)
Total Bilirubin: 0.7 mg/dL (ref 0.3–1.2)
Total Protein: 8 g/dL (ref 6.5–8.1)

## 2023-05-26 LAB — CBC
HCT: 47.7 % (ref 39.0–52.0)
Hemoglobin: 16.4 g/dL (ref 13.0–17.0)
MCH: 30.4 pg (ref 26.0–34.0)
MCHC: 34.4 g/dL (ref 30.0–36.0)
MCV: 88.5 fL (ref 80.0–100.0)
Platelets: 219 10*3/uL (ref 150–400)
RBC: 5.39 MIL/uL (ref 4.22–5.81)
RDW: 14.4 % (ref 11.5–15.5)
WBC: 13.9 10*3/uL — ABNORMAL HIGH (ref 4.0–10.5)
nRBC: 0 % (ref 0.0–0.2)

## 2023-05-26 LAB — LIPASE, BLOOD: Lipase: 10 U/L — ABNORMAL LOW (ref 11–51)

## 2023-05-26 MED ORDER — ONDANSETRON 4 MG PO TBDP: 4.0000 mg | ORAL_TABLET | Freq: Three times a day (TID) | ORAL | 0 refills | Status: AC | PRN

## 2023-05-26 MED ORDER — POLYETHYLENE GLYCOL 3350 17 GM/SCOOP PO POWD: 1.0000 | Freq: Once | ORAL | 0 refills | Status: AC

## 2023-05-26 MED ORDER — ONDANSETRON 4 MG PO TBDP
4.0000 mg | ORAL_TABLET | Freq: Once | ORAL | Status: AC | PRN
  Administered 2023-05-26: 4 mg via ORAL
  Filled 2023-05-26: qty 1

## 2023-05-26 MED ORDER — IOHEXOL 300 MG/ML  SOLN
100.0000 mL | Freq: Once | INTRAMUSCULAR | Status: AC | PRN
  Administered 2023-05-26: 100 mL via INTRAVENOUS

## 2023-05-26 MED ORDER — ONDANSETRON HCL 4 MG/2ML IJ SOLN
4.0000 mg | Freq: Once | INTRAMUSCULAR | Status: AC
  Administered 2023-05-26: 4 mg via INTRAVENOUS
  Filled 2023-05-26: qty 2

## 2023-05-26 MED ORDER — DICYCLOMINE HCL 20 MG PO TABS: 20.0000 mg | ORAL_TABLET | Freq: Two times a day (BID) | ORAL | 0 refills | Status: DC

## 2023-05-26 NOTE — ED Notes (Signed)
Pt instructed to doff clothes and don gown.

## 2023-05-26 NOTE — ED Notes (Signed)
Patient transported to CT 

## 2023-05-26 NOTE — Discharge Instructions (Signed)
It was a pleasure taking care of you here in the emergency department  You need to follow-up with a gastroenterologist to have a colonoscopy.  As discussed in the room we have started you on MiraLAX for your constipation.  I would start out with 1 scoop in 8 ounces of liquid of your choice.  Drink until complete.  Repeat every hour into you have a soft bowel movement.  Once you are having normal bowel movements I would do MiraLAX once to twice a daily to continue to have a soft stool.  You may take Zofran as needed for any nausea or vomiting.  As discussed in room your CT scan showed multiple lymph nodes.  I recommend you follow-up with your primary care provider for further workup of this  Return for any worsening symptoms

## 2023-05-26 NOTE — ED Provider Notes (Signed)
[place] EMERGENCY DEPARTMENT AT [facility] Provider Note   CSN: [id number] Arrival date & time: 05/26/23  1319    History  Chief Complaint  Patient presents with   Abdominal Pain    Timothy Hernandez Sr. is a 57 y.o. male no significant past medical history here for evaluation of abdominal pain, vomiting constipation.  States he has some chronic constipation at baseline however worsened over the last week.  Her last 24 hours he started having NBNB emesis.  Passing flatus, last episode this morning.  States he has been doing prune juice without relief.  He has never had a colonoscopy.  Has had prior history of hernia repair, no prior history of SBO.  No fever, chest pain, shortness of breath, congestion, rhinorrhea, back pain, dysuria hematuria. States he had some small " pebbles this morning" No BRBPR or melena.  HPI     Home Medications Prior to Admission medications   Medication Sig Start Date End Date Taking? Authorizing Provider  dicyclomine (BENTYL) 20 MG tablet Take 1 tablet (20 mg total) by mouth 2 (two) times daily. 05/26/23  Yes Rebbie Lauricella A, PA-C  ondansetron (ZOFRAN-ODT) 4 MG disintegrating tablet Take 1 tablet (4 mg total) by mouth every 8 (eight) hours as needed. 05/26/23  Yes Rimsha Trembley A, PA-C  polyethylene glycol powder (GLYCOLAX/MIRALAX) 17 GM/SCOOP powder Take 255 g by mouth once for 1 dose. 05/26/23 05/26/23 Yes Kayn Haymore A, PA-C  cyclobenzaprine (FLEXERIL) 10 MG tablet Take 1 tablet (10 mg total) by mouth 3 (three) times daily as needed for muscle spasms. 07/06/14   Copland, Gwenlyn Found, MD      Allergies    Patient has no known allergies.    Review of Systems   Review of Systems  Constitutional: Negative.   HENT: Negative.    Respiratory: Negative.    Cardiovascular: Negative.   Gastrointestinal:  Positive for abdominal pain, nausea and vomiting. Negative for abdominal distention, anal bleeding, blood in stool, constipation, diarrhea  and rectal pain.  Genitourinary: Negative.   Musculoskeletal: Negative.   Skin: Negative.   Neurological: Negative.   All other systems reviewed and are negative.   Physical Exam Updated Vital Signs BP (!) 155/95 (BP Location: Right Arm)   Pulse (!) 58   Temp 98.7 F (37.1 C) (Oral)   Resp 16   Ht 6\' 2"  (1.88 m)   Wt 86.2 kg   SpO2 98%   BMI 24.39 kg/m  Physical Exam Vitals and nursing note reviewed.  Constitutional:      General: He is not in acute distress.    Appearance: He is well-developed. He is not ill-appearing, toxic-appearing or diaphoretic.  HENT:     Head: Atraumatic.  Eyes:     Pupils: Pupils are equal, round, and reactive to light.  Cardiovascular:     Rate and Rhythm: Normal rate and regular rhythm.     Heart sounds: Normal heart sounds.  Pulmonary:     Effort: Pulmonary effort is normal. No respiratory distress.     Breath sounds: Normal breath sounds.     Comments: Clear bilaterally, speaks in full sentences without difficulty Abdominal:     General: Bowel sounds are normal. There is no distension.     Palpations: Abdomen is soft.     Tenderness: There is generalized abdominal tenderness.     Comments: Soft, generalized tenderness, no focal pain on palpation  Musculoskeletal:        General: Normal range of motion.  Cervical back: Normal range of motion and neck supple.  Skin:    General: Skin is warm and dry.     Capillary Refill: Capillary refill takes less than 2 seconds.     Comments: No obvious rashes or lesions on exposed skin  Neurological:     General: No focal deficit present.     Mental Status: He is alert and oriented to person, place, and time.     ED Results / Procedures / Treatments   Labs (all labs ordered are listed, but only abnormal results are displayed) Labs Reviewed  LIPASE, BLOOD - Abnormal; Notable for the following components:      Result Value   Lipase 10 (*)    All other components within normal limits   COMPREHENSIVE METABOLIC PANEL - Abnormal; Notable for the following components:   Glucose, Bld 111 (*)    All other components within normal limits  CBC - Abnormal; Notable for the following components:   WBC 13.9 (*)    All other components within normal limits  URINALYSIS, ROUTINE W REFLEX MICROSCOPIC - Abnormal; Notable for the following components:   Specific Gravity, Urine 1.035 (*)    Hgb urine dipstick MODERATE (*)    Ketones, ur 15 (*)    Protein, ur 30 (*)    All other components within normal limits    EKG None  Radiology CT ABDOMEN PELVIS W CONTRAST  Result Date: 05/26/2023 CLINICAL DATA:  Abdominal pain and constipation for a week. Emesis last night and again this morning. History of previous hernia repair EXAM: CT ABDOMEN AND PELVIS WITH CONTRAST TECHNIQUE: Multidetector CT imaging of the abdomen and pelvis was performed using the standard protocol following bolus administration of intravenous contrast. RADIATION DOSE REDUCTION: This exam was performed according to the departmental dose-optimization program which includes automated exposure control, adjustment of the mA and/or kV according to patient size and/or use of iterative reconstruction technique. CONTRAST:  OMNIPAQUE IOHEXOL 300 MG/ML  SOLN COMPARISON:  None Available. FINDINGS: Lower chest: There is some linear opacity lung bases likely scar or atelectasis. No pleural effusion. Hepatobiliary: Liver some scattered subcentimeter low-attenuation lesions which are too small to completely characterize. Statistically likely benign cystic lesions and no specific imaging follow-up. Patent portal vein. The gallbladder is present. Pancreas: Unremarkable. No pancreatic ductal dilatation or surrounding inflammatory changes. Spleen: Normal in size without focal abnormality. Adrenals/Urinary Tract: Adrenal glands are unremarkable. Kidneys are normal, without renal calculi, focal lesion, or hydronephrosis. Bladder is unremarkable.  Stomach/Bowel: Slight fold thickening and wall thickening along the body and antrum of the stomach. Please correlate with any symptoms. Further workup as clinically appropriate. Otherwise on this non oral contrast exam the small bowel is nondilated. Large bowel has a normal course and caliber with scattered colonic stool. Normal appendix extends medial to the cecum in the right hemipelvis. Vascular/Lymphatic: Normal caliber aorta with scattered diffuse vascular calcifications along the aorta and branch vessels. 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 Few prominent left upper quadrant mesenteric nodes identified such as series 2, image 24, image 28. Nonpathologic by size criteria but more numerous than usually seen. Also a prominent left external iliac chain lymph node with short axis of 9 mm on series 2, image 72. Reproductive: Prostate is unremarkable. Other: No abdominal wall hernia or abnormality. No abdominopelvic ascites. Musculoskeletal: Scattered degenerative changes of the spine and pelvis. Bridging osteophytes along the left sacroiliac joint. IMPRESSION: Mild fold thickening along the body and antrum of the  stomach. Few reactive nodes in the left upper quadrant of the abdomen along the mesentery. Please correlate with any particular symptoms and further workup as clinically appropriate. No bowel obstruction.  Scattered stool.  Normal appendix. Electronically Signed   By: Karen Kays M.D.   On: 05/26/2023 16:58    Procedures Procedures    Medications Ordered in ED Medications  ondansetron (ZOFRAN-ODT) disintegrating tablet 4 mg (4 mg Oral Given 05/26/23 1341)  ondansetron (ZOFRAN) injection 4 mg (4 mg Intravenous Given 05/26/23 1532)  iohexol (OMNIPAQUE) 300 MG/ML solution 100 mL (100 mLs Intravenous Contrast Given 05/26/23 1522)    ED Course/ Medical Decision Making/ A&P   57 year old here for evaluation of nausea, vomiting constipation.  Sounds like he has some constipation at  baseline however worsened over the last week, attempting prune juice at home without relief.  He is passing flatus.  No history of SBO however does have history of prior hernia repair.  He is some generalized abdominal tenderness on exam which does not radiate.  No focal pain.  No urinary symptoms.  His heart and lungs are clear.  His abdomen is soft, no rebound or guarding.  No prior colonoscopy.  Will plan on labs, imaging, reassess.  He does not want anything for pain at this time.  Labs and imaging personally viewed and interpreted:  CBC leukocytosis 13.9 Lipase 10 Metabolic panel glucose 111 UA negative for infection does show Hgb, denies any gross hematuria.  He is a tobacco user.  No history of stones, UTI, bladder CA CT AP Mild fold thickening along the body and antrum of the stomach. Few  reactive nodes in the left upper quadrant of the abdomen along the  mesentery. Please correlate with any particular symptoms and further  workup as clinically appropriate.    No bowel obstruction.  Scattered stool.  Normal appendix.    Patient reassessed.  We discussed labs and imaging.  Will have him follow-up with PCP to establish care as well as GI.  He also needs follow-up for his lymphadenopathy.  Orders placed.  With regards to his constipation, no stool ball on CT scan.  Will have him start on MiraLAX outpatient.  He is tolerating p.o. intake at discharge.  Will have him follow-up outpatient.  The patient has been appropriately medically screened and/or stabilized in the ED. I have low suspicion for any other emergent medical condition which would require further screening, evaluation or treatment in the ED or require inpatient management.  Patient is hemodynamically stable and in no acute distress.  Patient able to ambulate in department prior to ED.  Evaluation does not show acute pathology that would require ongoing or additional emergent interventions while in the emergency department or  further inpatient treatment.  I have discussed the diagnosis with the patient and answered all questions.  Pain is been managed while in the emergency department and patient has no further complaints prior to discharge.  Patient is comfortable with plan discussed in room and is stable for discharge at this time.  I have discussed strict return precautions for returning to the emergency department.  Patient was encouraged to follow-up with PCP/specialist refer to at discharge.                                 Medical Decision Making Amount and/or Complexity of Data Reviewed Independent Historian: friend External Data Reviewed: labs, radiology and notes. Labs: ordered. Decision-making  details documented in ED Course. Radiology: ordered and independent interpretation performed. Decision-making details documented in ED Course.  Risk OTC drugs. Prescription drug management. Parenteral controlled substances. Decision regarding hospitalization. Diagnosis or treatment significantly limited by social determinants of health.          Final Clinical Impression(s) / ED Diagnoses Final diagnoses:  Constipation, unspecified constipation type  Nausea and vomiting, unspecified vomiting type  Lymphadenopathy    Rx / DC Orders ED Discharge Orders          Ordered    ondansetron (ZOFRAN-ODT) 4 MG disintegrating tablet  Every 8 hours PRN        05/26/23 1807    polyethylene glycol powder (GLYCOLAX/MIRALAX) 17 GM/SCOOP powder   Once        05/26/23 1807    dicyclomine (BENTYL) 20 MG tablet  2 times daily        05/26/23 1807              Antonya Leeder A, PA-C 05/26/23 2332    Coral Spikes, DO 05/27/23 [phone]

## 2023-05-26 NOTE — ED Triage Notes (Signed)
Pt presents with abd pain and constipation x 1 week. Pt vomited last PM and this AM. Pt states he did have a small, hard BM this AM.

## 2023-06-01 ENCOUNTER — Ambulatory Visit: Payer: 59 | Admitting: Internal Medicine

## 2023-06-01 ENCOUNTER — Other Ambulatory Visit (HOSPITAL_COMMUNITY)
Admission: RE | Admit: 2023-06-01 | Discharge: 2023-06-01 | Disposition: A | Discharge status: Home / Self Care | Payer: 59 | Source: Ambulatory Visit | Attending: Internal Medicine | Admitting: Internal Medicine

## 2023-06-01 ENCOUNTER — Encounter: Payer: Self-pay | Admitting: Internal Medicine

## 2023-06-01 VITALS — BP 115/69 | HR 57 | Temp 97.9°F | Ht 74.0 in | Wt 187.8 lb

## 2023-06-01 DIAGNOSIS — G4720 Circadian rhythm sleep disorder, unspecified type: Secondary | ICD-10-CM | POA: Insufficient documentation

## 2023-06-01 DIAGNOSIS — Z8719 Personal history of other diseases of the digestive system: Secondary | ICD-10-CM

## 2023-06-01 DIAGNOSIS — F17200 Nicotine dependence, unspecified, uncomplicated: Secondary | ICD-10-CM | POA: Insufficient documentation

## 2023-06-01 DIAGNOSIS — D72829 Elevated white blood cell count, unspecified: Secondary | ICD-10-CM

## 2023-06-01 DIAGNOSIS — R1033 Periumbilical pain: Secondary | ICD-10-CM | POA: Diagnosis not present

## 2023-06-01 DIAGNOSIS — M51362 Other intervertebral disc degeneration, lumbar region with discogenic back pain and lower extremity pain: Secondary | ICD-10-CM

## 2023-06-01 DIAGNOSIS — R590 Localized enlarged lymph nodes: Secondary | ICD-10-CM

## 2023-06-01 DIAGNOSIS — R3129 Other microscopic hematuria: Secondary | ICD-10-CM

## 2023-06-01 DIAGNOSIS — M25522 Pain in left elbow: Secondary | ICD-10-CM | POA: Insufficient documentation

## 2023-06-01 DIAGNOSIS — R634 Abnormal weight loss: Secondary | ICD-10-CM | POA: Diagnosis present

## 2023-06-01 DIAGNOSIS — R319 Hematuria, unspecified: Secondary | ICD-10-CM | POA: Insufficient documentation

## 2023-06-01 LAB — CBC WITH DIFFERENTIAL/PLATELET
Basophils Absolute: 0.1 10*3/uL (ref 0.0–0.1)
Basophils Relative: 1.3 % (ref 0.0–3.0)
Eosinophils Absolute: 0.8 10*3/uL — ABNORMAL HIGH (ref 0.0–0.7)
Eosinophils Relative: 7.7 % — ABNORMAL HIGH (ref 0.0–5.0)
HCT: 45.1 % (ref 39.0–52.0)
Hemoglobin: 14.8 g/dL (ref 13.0–17.0)
Lymphocytes Relative: 23.1 % (ref 12.0–46.0)
Lymphs Abs: 2.4 10*3/uL (ref 0.7–4.0)
MCHC: 32.8 g/dL (ref 30.0–36.0)
MCV: 92.6 fL (ref 78.0–100.0)
Monocytes Absolute: 0.9 10*3/uL (ref 0.1–1.0)
Monocytes Relative: 8.4 % (ref 3.0–12.0)
Neutro Abs: 6.1 10*3/uL (ref 1.4–7.7)
Neutrophils Relative %: 59.5 % (ref 43.0–77.0)
Platelets: 203 10*3/uL (ref 150.0–400.0)
RBC: 4.87 Mil/uL (ref 4.22–5.81)
RDW: 14.8 % (ref 11.5–15.5)
WBC: 10.2 10*3/uL (ref 4.0–10.5)

## 2023-06-01 LAB — LIPID PANEL
Cholesterol: 156 mg/dL (ref 0–200)
HDL: 37.2 mg/dL — ABNORMAL LOW (ref >39.00)
LDL Cholesterol: 102 mg/dL — ABNORMAL HIGH (ref 0–99)
NonHDL: 118.68
Total CHOL/HDL Ratio: 4
Triglycerides: 82 mg/dL (ref 0.0–149.0)
VLDL: 16.4 mg/dL (ref 0.0–40.0)

## 2023-06-01 LAB — COMPREHENSIVE METABOLIC PANEL
ALT: 14 U/L (ref 0–53)
AST: 23 U/L (ref 0–37)
Albumin: 4 g/dL (ref 3.5–5.2)
Alkaline Phosphatase: 78 U/L (ref 39–117)
BUN: 10 mg/dL (ref 6–23)
CO2: 29 meq/L (ref 19–32)
Calcium: 9.3 mg/dL (ref 8.4–10.5)
Chloride: 103 meq/L (ref 96–112)
Creatinine, Ser: 1.13 mg/dL (ref 0.40–1.50)
GFR: 72.72 mL/min (ref >60.00)
Glucose, Bld: 91 mg/dL (ref 70–99)
Potassium: 4.7 meq/L (ref 3.5–5.1)
Sodium: 137 meq/L (ref 135–145)
Total Bilirubin: 0.7 mg/dL (ref 0.2–1.2)
Total Protein: 6.6 g/dL (ref 6.0–8.3)

## 2023-06-01 LAB — URINALYSIS, ROUTINE W REFLEX MICROSCOPIC
Bilirubin Urine: NEGATIVE
Ketones, ur: NEGATIVE
Leukocytes,Ua: NEGATIVE
Nitrite: NEGATIVE
Specific Gravity, Urine: >=1.03 — AB (ref 1.000–1.030)
Total Protein, Urine: 30 — AB
Urine Glucose: NEGATIVE
Urobilinogen, UA: 0.2 (ref 0.0–1.0)
pH: 6 (ref 5.0–8.0)

## 2023-06-01 LAB — SEDIMENTATION RATE: Sed Rate: 23 mm/h — ABNORMAL HIGH (ref 0–20)

## 2023-06-01 LAB — C-REACTIVE PROTEIN: CRP: <1 mg/dL (ref 0.5–20.0)

## 2023-06-01 LAB — URIC ACID: Uric Acid, Serum: 4.1 mg/dL (ref 4.0–7.8)

## 2023-06-01 LAB — B12 AND FOLATE PANEL
Folate: 7.3 ng/mL (ref >5.9)
Vitamin B-12: 492 pg/mL (ref 211–911)

## 2023-06-01 LAB — PSA: PSA: 0.8 ng/mL (ref 0.10–4.00)

## 2023-06-01 MED ORDER — NICOTINE 14 MG/24HR TD PT24: MEDICATED_PATCH | TRANSDERMAL | 0 refills | Status: DC

## 2023-06-01 MED ORDER — NICOTINE 7 MG/24HR TD PT24: MEDICATED_PATCH | TRANSDERMAL | 0 refills | Status: DC

## 2023-06-01 MED ORDER — NICOTINE POLACRILEX 2 MG MT GUM: CHEWING_GUM | OROMUCOSAL | 0 refills | Status: DC

## 2023-06-01 MED ORDER — NICOTINE POLACRILEX 2 MG MT LOZG: LOZENGE | OROMUCOSAL | 0 refills | Status: DC

## 2023-06-01 NOTE — Assessment & Plan Note (Addendum)
He exhibits acute on chronic abdominal pain in the umbilical region, likely linked to a previous mesh repair for an umbilical hernia, accompanied by a recent episode of constipation and vomiting and emergency room visit.  It is a little better than emergency room but it is not resolving. CT abdomen/pelvis showed no clear cause. He is scheduled for a colonoscopy next week. We will continue Miralax as directed and consider a surgical referral if the pain persists.  Suspicious for mesh complication like adhesion due to mesh history and localization to umbilical area.

## 2023-06-01 NOTE — Progress Notes (Signed)
[facility]  Phone: [phone]  - Medical Office Visit -  Visit Date: 06/01/2023 Patient: Timothy FOREE Sr.   DOB: Apr 23, 1966   57 y.o. Male  MRN: [id number] Patient Care Team: Lula Olszewski, MD as PCP - General (Internal Medicine) Today's Health Care Provider: Lula Olszewski, MD  =========================================== Assessment & Plan Abdominal lymphadenopathy Lymphadenopathy is noted in the left upper quadrant of his abdomen, alongside chronic leukocytosis with a white blood cell count consistently between 11-17 for the past 15 years. We will refer him to a hematologist/oncologist for further evaluation. Risk factors prior abdomen surgery, and smoking history. Acute periumbilical pain He exhibits acute on chronic abdominal pain in the umbilical region, likely linked to a previous mesh repair for an umbilical hernia, accompanied by a recent episode of constipation and vomiting and emergency room visit.  It is a little better than emergency room but it is not resolving. CT abdomen/pelvis showed no clear cause. He is scheduled for a colonoscopy next week. We will continue Miralax as directed and consider a surgical referral if the pain persists.  Suspicious for mesh complication like adhesion due to mesh history and localization to umbilical area. Left elbow pain His left elbow pain is likely due to tennis elbow from repetitive lifting. We recommend an elbow strap for the left elbow and advise ibuprofen for pain relief. Consider moving the elbow sleeve to the right elbow. History of umbilical hernia Mesh repaired, but with pain WEIGHT LOSS, ABNORMAL Resolved seemingly Wt Readings from Last 10 Encounters:  06/01/23 187 lb 12.8 oz (85.2 kg)  05/26/23 190 lb (86.2 kg)  07/06/14 183 lb 6.4 oz (83.2 kg)  06/30/14 184 lb (83.5 kg)   Removing from problem list  TOBACCO USER He is a chronic smoker, currently smoking less than 10 cigarettes per day, and  experiencing symptoms of Raynaud's phenomenon. We will initiate nicotine replacement therapy with 14mg  patches for 6 weeks, 2mg  lozenges, and gum, and provide resources for smoking cessation (1-800-QUIT-NOW). DYSFNCT ASSO W/SLEEP STGES/AROUSAL FRM SLEEP Resolved, grief related Leukocytosis, unspecified type Lab Results  Component Value Date/Time   WBC 10.2 06/01/2023 10:06 AM   WBC 13.9 (H) 05/26/2023 01:43 PM   WBC 10.7 (H) 06/30/2014 05:00 PM   WBC 17.7 (H) 07/28/2011 04:13 PM   WBC 11.3 (H) 02/25/2009 08:09 AM  Due to over a decade of high white blood cell(s), and high in emergency room, and LAD in CT abdomen/pelvis we launched evaluation and referral heme... .then labwork returned white blood cell(s) as normal. Other microscopic hematuria Microscopic blood has been detected in his urine without any other urinary symptoms. We will refer him to a urologist for further evaluation. Degeneration of intervertebral disc of lumbar region with discogenic back pain and lower extremity pain He reports chronic low back pain radiating down the leg and has declined an MRI and pain management referral. We advise regular breaks with stretches and walking around, and consider heat or ice for low back pain.  General Health Maintenance   We will encouraged patient to to get the influenza vaccine today and order comprehensive lab work including a complete blood count, metabolic panel, urine culture, sedimentation rate, CRP, PSA, thyroid function tests, B12 and folate levels, HIV test, Hepatitis B and C tests, cholesterol panel, immunoglobulins, ANA, celiac disease screening, and CEA. He is advised to keep a symptom diary and schedule a follow-up appointment in 2 weeks to review lab results.      Orders  Placed This Encounter  Procedures   Urine Culture   Urinalysis, Routine w reflex microscopic   Pathologist smear review   CBC with Differential/Platelet   Comp Met (CMET)   Sedimentation rate   C-reactive  protein   PSA   TSH Rfx on Abnormal to Free T4   HIV antibody (with reflex)   B12 and Folate Panel   Hepatitis B Surface AntiGEN   Hepatitis B surface antibody,qualitative   Lipid panel   IgG, IgA, IgM   ANA w/Reflex if Positive   Rheumatoid factor   Cyclic citrul peptide antibody, IgG ([facility])   Celiac Disease Comprehensive Panel with Reflexes    Order Specific Question:   Release to patient    Answer:   Immediate [1]   Tryptase   Uric acid   CEA   Hepatitis C Antibody    Standing Status:   Future    Standing Expiration Date:   05/31/2024   Ambulatory referral to Hematology / Oncology    Referral Priority:   Routine    Referral Type:   Consultation    Referral Reason:   Specialty Services Required    Requested Specialty:   Oncology    Number of Visits Requested:   1   Ambulatory referral to Urology    Referral Priority:   Routine    Referral Type:   Consultation    Referral Reason:   Specialty Services Required    Requested Specialty:   Urology    Number of Visits Requested:   1       Subjective  57 y.o. male who has Leucocytosis; TOBACCO USER; Diaphragmatic hernia; CORNS AND CALLOSITIES; DISC DISEASE, CERVICAL; History of umbilical hernia; Hematuria; and Left elbow pain on their problem list. His reasons/main concerns/chief complaints for today's office visit are New Patient (Initial Visit) (Scheduled for a colonoscopy on 10/17. ), Left elbow pain (Thinks it may be tendinitis. Painful at times.), and Enlarged lymph nodes in chest (Went to ED on 10/5, was seen on imaging.)   ----------------------------------------------------------------------------------------------------------------- AI-Extracted: Discussed the use of AI scribe software for clinical note transcription with the patient, who gave verbal consent to proceed.  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 The  patient had not seen a doctor since 2015. The patient is a smoker with no known allergies and a history of neck pain. The patient's recent CT scan from an ER visit revealed scarring in the lungs, likely due to smoking, and small spots on the liver deemed too small to warrant follow-up. The pancreas, spleen, and adrenal glands appeared normal. However, there was noted fold thickening and wall thickening on the antrum of the stomach, which correlated with the patient's reported stomach symptoms.  The patient also reported a history of abdominal hernia repaired with mesh in 1999, but has continued to experience issues in the area around the umbilicus. Recently, the patient experienced a bout of constipation lasting four days, which led to bloating, loss of appetite, and vomiting. The patient sought emergency care for these symptoms. The patient's bowel movements have since resumed but are not solid, and the patient continues to take Miralax as directed.  In addition to the abdominal issues, the patient reported persistent left elbow pain, which has been present for over six months. The pain is exacerbated by pressure and stretching. The patient's job involves lifting 10-15 pounds regularly, which may contribute to  the elbow pain. The patient also reported a tingling sensation and numbness in the fingertips, particularly when cold, which was identified as Raynaud's phenomenon, a condition often seen in smokers.  The patient also reported a history of sciatic nerve pain, particularly on the left side, which is exacerbated by long periods of sitting due to the patient's job. The patient has found some relief with a seat cushion.  The patient has a history of mildly elevated white blood cell count for the past 15 years, which has raised concerns about potential lymphoma. The patient also had microscopic blood in the urine during the recent ER visit, which could indicate bladder cancer, another condition associated  with smoking.      ----------------------------------------------------------------------------------------------------------------- He has a past medical history of Anxiety (01/14/08), Depression, DYSFNCT ASSO W/SLEEP STGES/AROUSAL FRM SLEEP (12/24/2007), and WEIGHT LOSS, ABNORMAL (11/19/2007). Problem list overviews that were updated at today's visit: Problem  History of Umbilical Hernia   Constipation->  Pain and nausea and vomiting went to emergency room. led to emergency room visit finding CT abdomen/pelvis.   Mild fold thickening along the body and antrum of the stomach. Few reactive nodes in the left upper quadrant of the abdomen along the mesentery. Please correlate with any particular symptoms and further workup as clinically appropriate.   Hematuria  Left Elbow Pain  Leucocytosis  DYSFNCT ASSO W/SLEEP STGES/AROUSAL FRM SLEEP (Resolved)       WEIGHT LOSS, ABNORMAL (Resolved)   Wt Readings from Last 10 Encounters:  06/01/23 187 lb 12.8 oz (85.2 kg)  05/26/23 190 lb (86.2 kg)  07/06/14 183 lb 6.4 oz (83.2 kg)  06/30/14 184 lb (83.5 kg)          Medications reviewed and modified: Current Outpatient Medications on File Prior to Visit  Medication Sig   amoxicillin (AMOXIL) 500 MG capsule Take 500 mg by mouth 3 (three) times daily.   cyclobenzaprine (FLEXERIL) 10 MG tablet Take 1 tablet (10 mg total) by mouth 3 (three) times daily as needed for muscle spasms.   dicyclomine (BENTYL) 20 MG tablet Take 1 tablet (20 mg total) by mouth 2 (two) times daily.   HYDROcodone-acetaminophen (NORCO/VICODIN) 5-325 MG tablet Take 1 tablet by mouth every 6 (six) hours as needed.   ibuprofen (ADVIL) 800 MG tablet Take 800 mg by mouth every 6 (six) hours as needed.   ondansetron (ZOFRAN-ODT) 4 MG disintegrating tablet Take 1 tablet (4 mg total) by mouth every 8 (eight) hours as needed.   No current facility-administered medications on file prior to visit.  There are no discontinued  medications.  Problems: has Leucocytosis; TOBACCO USER; Diaphragmatic hernia; CORNS AND CALLOSITIES; DISC DISEASE, CERVICAL; History of umbilical hernia; Hematuria; and Left elbow pain on their problem list. Current Meds  Medication Sig   amoxicillin (AMOXIL) 500 MG capsule Take 500 mg by mouth 3 (three) times daily.   cyclobenzaprine (FLEXERIL) 10 MG tablet Take 1 tablet (10 mg total) by mouth 3 (three) times daily as needed for muscle spasms.   dicyclomine (BENTYL) 20 MG tablet Take 1 tablet (20 mg total) by mouth 2 (two) times daily.   HYDROcodone-acetaminophen (NORCO/VICODIN) 5-325 MG tablet Take 1 tablet by mouth every 6 (six) hours as needed.   ibuprofen (ADVIL) 800 MG tablet Take 800 mg by mouth every 6 (six) hours as needed.   nicotine (NICODERM CQ - DOSED IN MG/24 HOURS) 14 mg/24hr patch RX #1 Weeks 1-6: 14 mg x 1 patch daily. Wear for 24 hours. If you  have sleep disturbances, remove at bedtime.   nicotine (NICODERM CQ - DOSED IN MG/24 HR) 7 mg/24hr patch RX #2 Weeks 7-8: 7 mg x 1 patch dailyWear for 24 hours. If you have sleep disturbances, remove at bedtime..   nicotine polacrilex (COMMIT) 2 MG lozenge RX #1 Weeks 1-6: 1 lozenge every 1-2 hours. Use at least 9 lozenges per day for the first 6 weeks. Max 20 lozenges per day.   nicotine polacrilex (COMMIT) 2 MG lozenge RX #2 Weeks 7-9: 1 lozenge every 2-4 hours.Max 20 lozenges per day.   nicotine polacrilex (COMMIT) 2 MG lozenge RX #3 Weeks 10-12: 1 lozenge every 4-8 hours.Max 20 lozenges per day.   nicotine polacrilex (NICORETTE) 2 MG gum RX #1 Weeks 1-6: 1 piece every 1-2 hours.Use at least 9 pieces of gum per day for the first 6 weeks. Max 24 pieces per day.   nicotine polacrilex (NICORETTE) 2 MG gum RX #2 Weeks 7-9: 1 piece every 2-4 hours.Max 24 pieces per day.   nicotine polacrilex (NICORETTE) 2 MG gum RX #3 Weeks 10-12: 1 piece every 4-8 hours.Max 24 pieces per day.   ondansetron (ZOFRAN-ODT) 4 MG disintegrating tablet Take 1 tablet  (4 mg total) by mouth every 8 (eight) hours as needed.   Allergies:  No Known Allergies Past Medical History:  has a past medical history of Anxiety (01/14/08), Depression, DYSFNCT ASSO W/SLEEP STGES/AROUSAL FRM SLEEP (12/24/2007), and WEIGHT LOSS, ABNORMAL (11/19/2007). Past Surgical History:   has a past surgical history that includes Hernia repair. Social History:   reports that he has been smoking cigars and cigarettes. He has a 7.5 pack-year smoking history. He does not have any smokeless tobacco history on file. He reports current alcohol use. He reports current drug use. Frequency: 3.00 times per week. Drug: Marijuana. Family History:  family history includes COPD in his sister; Cancer in his maternal grandfather, maternal grandmother, and mother; Depression in his sister; Diabetes in his sister; Early death in his sister. Depression Screen and Health Maintenance:    06/01/2023    8:53 AM  PHQ 2/9 Scores  PHQ - 2 Score 0  PHQ- 9 Score 5   Health Maintenance  Topic Date Due   Colonoscopy  Never done   Zoster Vaccines- Shingrix (1 of 2) Never done   DTaP/Tdap/Td (2 - Tdap) 03/05/2019   INFLUENZA VACCINE  03/22/2023   COVID-19 Vaccine (3 - 2023-24 season) 08/21/2023 (Originally 04/22/2023)   Hepatitis C Screening  Completed   HIV Screening  Completed   HPV VACCINES  Aged Out   Immunization History  Administered Date(s) Administered   Influenza Whole 05/26/2009   PFIZER(Purple Top)SARS-COV-2 Vaccination 11/13/2019, 12/08/2019   Td 03/04/2009     Objective   Physical ExamBP 115/69 (BP Location: Right Arm, Patient Position: Sitting)   Pulse (!) 57   Temp 97.9 F (36.6 C) (Temporal)   Ht 6\' 2"  (1.88 m)   Wt 187 lb 12.8 oz (85.2 kg)   SpO2 99%   BMI 24.11 kg/m  Wt Readings from Last 10 Encounters:  06/01/23 187 lb 12.8 oz (85.2 kg)  05/26/23 190 lb (86.2 kg)  07/06/14 183 lb 6.4 oz (83.2 kg)  06/30/14 184 lb (83.5 kg)  Vital signs reviewed.  Nursing notes reviewed. Weight  trend reviewed. Abnormalities and problem-specific physical exam findings:  periumbilical and lateral epicondylar tenderness. Full range of motion. No scleral icterus. Uncomfortable appearing  General Appearance:  Well developed, well nourished, well-groomed, healthy-appearing male with Body mass index  is 24.11 kg/m. No acute distress appreciable.   Skin: Clear and well-hydrated. Pulmonary:  Normal work of breathing at rest, no respiratory distress apparent. SpO2: 99 %  Musculoskeletal: He demonstrates smooth and coordinated movements throughout all major joints.All extremities are intact.  Neurological:  Awake, alert, oriented, and engaged.  No obvious focal neurological deficits or cognitive impairments.  Sensorium seems unclouded.  Psychiatric:  Appropriate mood, pleasant and cooperative demeanor, cheerful and engaged during the exam  Reviewed Results & Data Results   LABS WBC: 11-17 Urinalysis: Hematuria  RADIOLOGY CT scan: Pulmonary scarring, hepatic microlesions, antral fold thickening and wall thickening, congenital venous variant, prominent left upper quadrant mesenteric nodes, left external iliac chain node        No results found for any visits on 06/01/23.  Admission on 05/26/2023, Discharged on 05/26/2023  Component Date Value   Lipase 05/26/2023 10 (L)    Sodium 05/26/2023 136    Potassium 05/26/2023 3.6    Chloride 05/26/2023 100    CO2 05/26/2023 24    Glucose, Bld 05/26/2023 111 (H)    BUN 05/26/2023 11    Creatinine, Ser 05/26/2023 1.08    Calcium 05/26/2023 9.7    Total Protein 05/26/2023 8.0    Albumin 05/26/2023 4.3    AST 05/26/2023 15    ALT 05/26/2023 8    Alkaline Phosphatase 05/26/2023 85    Total Bilirubin 05/26/2023 0.7    GFR, Estimated 05/26/2023 >60    Anion gap 05/26/2023 12    WBC 05/26/2023 13.9 (H)    RBC 05/26/2023 5.39    Hemoglobin 05/26/2023 16.4    HCT 05/26/2023 47.7    MCV 05/26/2023 88.5    MCH 05/26/2023 30.4    MCHC  05/26/2023 34.4    RDW 05/26/2023 14.4    Platelets 05/26/2023 219    nRBC 05/26/2023 0.0    Color, Urine 05/26/2023 YELLOW    APPearance 05/26/2023 CLEAR    Specific Gravity, Urine 05/26/2023 1.035 (H)    pH 05/26/2023 6.0    Glucose, UA 05/26/2023 NEGATIVE    Hgb urine dipstick 05/26/2023 MODERATE (A)    Bilirubin Urine 05/26/2023 NEGATIVE    Ketones, ur 05/26/2023 15 (A)    Protein, ur 05/26/2023 30 (A)    Nitrite 05/26/2023 NEGATIVE    Leukocytes,Ua 05/26/2023 NEGATIVE    RBC / HPF 05/26/2023 11-20    WBC, UA 05/26/2023 0-5    Bacteria, UA 05/26/2023 NONE SEEN    Squamous Epithelial / HPF 05/26/2023 0-5    Mucus 05/26/2023 PRESENT    No image results found.   CT ABDOMEN PELVIS W CONTRAST  Result Date: 05/26/2023 CLINICAL DATA:  Abdominal pain and constipation for a week. Emesis last night and again this morning. History of previous hernia repair EXAM: CT ABDOMEN AND PELVIS WITH CONTRAST TECHNIQUE: Multidetector CT imaging of the abdomen and pelvis was performed using the standard protocol following bolus administration of intravenous contrast. RADIATION DOSE REDUCTION: This exam was performed according to the departmental dose-optimization program which includes automated exposure control, adjustment of the mA and/or kV according to patient size and/or use of iterative reconstruction technique. CONTRAST:  OMNIPAQUE IOHEXOL 300 MG/ML  SOLN COMPARISON:  None Available. FINDINGS: Lower chest: There is some linear opacity lung bases likely scar or atelectasis. No pleural effusion. Hepatobiliary: Liver some scattered subcentimeter low-attenuation lesions which are too small to completely characterize. Statistically likely benign cystic lesions and no specific imaging follow-up. Patent portal vein. The gallbladder is present. Pancreas: Unremarkable.  No pancreatic ductal dilatation or surrounding inflammatory changes. Spleen: Normal in size without focal abnormality. Adrenals/Urinary  Tract: Adrenal glands are unremarkable. Kidneys are normal, without renal calculi, focal lesion, or hydronephrosis. Bladder is unremarkable. Stomach/Bowel: Slight fold thickening and wall thickening along the body and antrum of the stomach. Please correlate with any symptoms. Further workup as clinically appropriate. Otherwise on this non oral contrast exam the small bowel is nondilated. Large bowel has a normal course and caliber with scattered colonic stool. Normal appendix extends medial to the cecum in the right hemipelvis. Vascular/Lymphatic: Normal caliber aorta with scattered diffuse vascular calcifications along the aorta and branch vessels. 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 Few prominent left upper quadrant mesenteric nodes identified such as series 2, image 24, image 28. Nonpathologic by size criteria but more numerous than usually seen. Also a prominent left external iliac chain lymph node with short axis of 9 mm on series 2, image 72. Reproductive: Prostate is unremarkable. Other: No abdominal wall hernia or abnormality. No abdominopelvic ascites. Musculoskeletal: Scattered degenerative changes of the spine and pelvis. Bridging osteophytes along the left sacroiliac joint. IMPRESSION: Mild fold thickening along the body and antrum of the stomach. Few reactive nodes in the left upper quadrant of the abdomen along the mesentery. Please correlate with any particular symptoms and further workup as clinically appropriate. No bowel obstruction.  Scattered stool.  Normal appendix. Electronically Signed   By: Karen Kays M.D.   On: 05/26/2023 16:58    CT ABDOMEN PELVIS W CONTRAST  Result Date: 05/26/2023 CLINICAL DATA:  Abdominal pain and constipation for a week. Emesis last night and again this morning. History of previous hernia repair EXAM: CT ABDOMEN AND PELVIS WITH CONTRAST TECHNIQUE: Multidetector CT imaging of the abdomen and pelvis was performed using the standard protocol following  bolus administration of intravenous contrast. RADIATION DOSE REDUCTION: This exam was performed according to the departmental dose-optimization program which includes automated exposure control, adjustment of the mA and/or kV according to patient size and/or use of iterative reconstruction technique. CONTRAST:  OMNIPAQUE IOHEXOL 300 MG/ML  SOLN COMPARISON:  None Available. FINDINGS: Lower chest: There is some linear opacity lung bases likely scar or atelectasis. No pleural effusion. Hepatobiliary: Liver some scattered subcentimeter low-attenuation lesions which are too small to completely characterize. Statistically likely benign cystic lesions and no specific imaging follow-up. Patent portal vein. The gallbladder is present. Pancreas: Unremarkable. No pancreatic ductal dilatation or surrounding inflammatory changes. Spleen: Normal in size without focal abnormality. Adrenals/Urinary Tract: Adrenal glands are unremarkable. Kidneys are normal, without renal calculi, focal lesion, or hydronephrosis. Bladder is unremarkable. Stomach/Bowel: Slight fold thickening and wall thickening along the body and antrum of the stomach. Please correlate with any symptoms. Further workup as clinically appropriate. Otherwise on this non oral contrast exam the small bowel is nondilated. Large bowel has a normal course and caliber with scattered colonic stool. Normal appendix extends medial to the cecum in the right hemipelvis. Vascular/Lymphatic: Normal caliber aorta with scattered diffuse vascular calcifications along the aorta and branch vessels. 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 Few prominent left upper quadrant mesenteric nodes identified such as series 2, image 24, image 28. Nonpathologic by size criteria but more numerous than usually seen. Also a prominent left external iliac chain lymph node with short axis of 9 mm on series 2, image 72. Reproductive: Prostate is unremarkable. Other: No abdominal wall  hernia or abnormality. No abdominopelvic ascites. Musculoskeletal: Scattered degenerative changes of  the spine and pelvis. Bridging osteophytes along the left sacroiliac joint. IMPRESSION: Mild fold thickening along the body and antrum of the stomach. Few reactive nodes in the left upper quadrant of the abdomen along the mesentery. Please correlate with any particular symptoms and further workup as clinically appropriate. No bowel obstruction.  Scattered stool.  Normal appendix. Electronically Signed   By: Karen Kays M.D.   On: 05/26/2023 16:58       Additional notes: Initial Appointment Goals:  This initial visit focused on establishing a foundation for the patient's care. We collaboratively reviewed his medical history and medications in detail, updating the chart as shown in the encounter. Given the extensive information, we prioritized addressing his most pressing concerns, which he reported were: New Patient (Initial Visit) (Scheduled for a colonoscopy on 10/17. ), Left elbow pain (Thinks it may be tendinitis. Painful at times.), and Enlarged lymph nodes in chest (Went to ED on 10/5, was seen on imaging.)  While the complexity of the patient's medical picture may necessitate further evaluation in subsequent visits, we were able to develop a preliminary care plan together. To expedite a comprehensive plan at the next visit, we encouraged the patient to gather relevant medical records from previous providers. This collaborative approach will ensure a more complete understanding of the patient's health and inform the development of a personalized care plan. We look forward to continuing the conversation and working together with the patient on achieving his health goals.   Collaborative Documentation:  Today's encounter utilized real-time, dynamic patient engagement.  Patients actively participate by directly reviewing and assisting in updating their medical records through a shared screen. This  transparency empowers patients to visually confirm chart updates made by the healthcare provider.  This collaborative approach facilitates problem management as we jointly update the problem list, problem overview, and assessment/plan. Ultimately, this process enhances chart accuracy and completeness, fostering shared decision-making, patient education, and informed consent for tests and treatments.  Collaborative Treatment Planning:  Treatment plans were discussed and reviewed in detail.  Explained medication safety and potential side effects.  Encouraged participation and answered all patient questions, confirming understanding and comfort with the plan. Encouraged patient to contact our office if they have any questions or concerns. Agreed on patient returning to office if symptoms worsen, persist, or new symptoms develop. Discussed precautions in case of needing to visit the Emergency Department.   Attestation:  I have personally spent  52 minutes involved in face-to-face and non-face-to-face activities for this patient on the day of the visit. Professional time spent includes the following activities:  Preparing to see the patient by reviewing medical records prior to and during the encounter; Obtaining, documenting, and reviewing an updated medical history; Performing a medically appropriate examination;  Evaluating, synthesizing, and documenting the available clinical information in the EMR; Communicating, counseling, educating about results to the patient/not separately reported); Collaboratively developing and communicating an individualized treatment plan with the patient; Placing medically necessary orders (for medications/tests/procedures/referrals);   This time was independent of any separately billable procedure(s).  The extended duration of this patient visit was medically necessary due to several factors:  The patient's health condition is multifaceted, requiring a comprehensive evaluation of  patient and their past records to ensure accurate diagnosis and treatment planning; Effective patient education and communication, particularly for patients with complex care needs, often require additional time to ensure the patient (or caregivers) fully understand the care plan;  Coordination of care with other healthcare professionals and services depends  on thorough documentation, extending both documentation time and visit durations.  All these factors are integral to providing high-quality patient care and ensuring optimal health outcomes.  ----------------------------------------------------- Lula Olszewski, MD  06/01/2023 10:13 AM   [facility] at [facility]:  [phone]

## 2023-06-01 NOTE — Assessment & Plan Note (Signed)
He is a chronic smoker, currently smoking less than 10 cigarettes per day, and experiencing symptoms of Raynaud's phenomenon. We will initiate nicotine replacement therapy with 14mg  patches for 6 weeks, 2mg  lozenges, and gum, and provide resources for smoking cessation (1-800-QUIT-NOW).

## 2023-06-01 NOTE — Assessment & Plan Note (Signed)
Microscopic blood has been detected in his urine without any other urinary symptoms. We will refer him to a urologist for further evaluation.

## 2023-06-01 NOTE — Patient Instructions (Addendum)
VISIT SUMMARY:  During your visit, we discussed several health concerns including abdominal pain, enlarged lymph nodes, blood in your urine, tobacco use, left elbow pain, and sciatic nerve pain. We also discussed your general health maintenance.  YOUR PLAN:  -ABDOMINAL PAIN: Your abdominal pain is likely related to a previous hernia repair. We will continue your current medication (Miralax) and you are scheduled for a colonoscopy next week. If the pain continues, we may consider referring you to a surgeon.  -ENLARGED LYMPH NODES: You have enlarged lymph nodes and a consistently high white blood cell count, which could indicate a problem with your immune system. We will refer you to a specialist (hematologist/oncologist) for further evaluation.  -BLOOD IN URINE: We found microscopic blood in your urine, which could indicate a problem with your urinary system. We will refer you to a urologist for further evaluation.  -TOBACCO USE: As a chronic smoker, you are experiencing symptoms of Raynaud's phenomenon, a condition that affects blood flow to your fingers and toes. We will provide you with nicotine replacement therapy and resources to help you quit smoking.  -LEFT ELBOW PAIN: Your left elbow pain is likely due to repetitive lifting, a condition often called 'tennis elbow'. We recommend using an elbow strap and taking ibuprofen for pain relief.  -SCIATIC NERVE PAIN: You reported chronic low back pain that radiates down your leg, a condition known as sciatica. We recommend taking regular breaks with stretches and walking, and using heat or ice for pain relief.  -GENERAL HEALTH MAINTENANCE: We will give you the flu vaccine today and order a series of lab tests to check your overall health. Please keep a diary of your symptoms and schedule a follow-up appointment in 2 weeks to review the lab results.  INSTRUCTIONS:  Please continue to take your medications as directed. Remember to use the nicotine  replacement therapy and resources provided to help you quit smoking. Use the elbow strap and ibuprofen for your elbow pain, and take regular breaks with stretches and walking for your sciatica. Keep a diary of your symptoms and schedule a follow-up appointment in 2 weeks to review your lab results.  # After-Visit Summary and Instructions  Dear Timothy Hernandez,   Thank you for coming in today. We've created a plan to address your concerns and improve your health based on our discussion. Here's a summary of what we talked about and what you need to do next:  ## Your Health Concerns 1. Abdominal pain with moderate discomfort and bowel dysfunction 2. Tennis elbow (lateral epicondylitis) 3. Sciatic nerve pain 4. 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 Please check MyChart for results messages  ### Specialist Referrals We've made the following referrals for you: 1. Hematology/Oncology (Hem/Onc): To evaluate your elevated white blood cell count 2. Urology: To assess any urinary tract issues 3. Gastroenterology (GI): Keep your existing appointment for further evaluation of abdominal issues  Please ensure you schedule these appointments as soon as possible.  ### Follow-up Appointment Please schedule a follow-up appointment in 2 weeks to review your test results and discuss next steps.  ## Managing Your Symptoms  ### For Abdominal Pain and Bowel Dysfunction - Follow a high-fiber, high-liquid diet until your colonoscopy -  Eat smaller, more frequent meals - Avoid spicy, fatty, or gas-producing foods - Use a heating pad on your abdomen for comfort if needed - Notify us if your abdominal pain worsens significantly  before your colonoscopy  ### For Tennis Elbow - Use tennis elbow straps as discussed - Apply ice for 15-20 minutes every 2-3 hours - Take ibuprofen as directed for pain relief, unless contraindicated - Perform gentle stretching exercises:   1. Wrist flexor stretch: Extend your arm, palm up. Use your other hand to gently bend your wrist back. Hold for 15-30 seconds.   2. Wrist extensor stretch: Extend your arm, palm down. Gently bend your wrist downward. Hold for 15-30 seconds. - Repeat each stretch 3-5 times, 2-3 times daily  ### For Sciatic Nerve Pain - Continue using the seat cushion while driving - Take regular breaks during long drives to stretch and walk around - Try these exercises:   1. Knee-to-chest stretch: Lie on your back, bring one knee to your chest, hold for 30 seconds. Repeat with the other leg.   2. Seated spinal twist: Sit on the floor with legs extended. Bend your right knee and place your foot outside your left thigh. Twist your torso to the right, holding for 30 seconds. Repeat on the other side. - Apply heat or ice to the affected area for 15-20 minutes at a time  ### For Buerger's Disease and Smoking Cessation - Use the provided nicotine replacement therapy as directed:   - Patches: Apply one patch daily as instructed   - Gum: Chew a piece when you have a craving   - Lozenges: Use as directed when you feel the urge to smoke - Avoid all tobacco products - Stay active and maintain good circulation by walking regularly - Keep your hands and feet warm to promote blood flow  ## Work Modifications - When lifting boxes:   - Keep the load close to your body   - Bend at your knees, not your waist   - Avoid twisting your body while lifting - Take regular breaks to stretch and change positions  ## When to Seek Immediate Medical Attention Go to the emergency room or call 911 if you experience: - Severe, sudden abdominal pain - Persistent vomiting - High fever (over  101F or 38.3C) - Sudden weakness or numbness in your legs - Signs of decreased circulation in your hands or feet (severe pain, coldness, or color changes)  ## Questions or Concerns? If you have any questions or if your symptoms worsen before your follow-up appointment, please don't hesitate to contact our office.  Remember, your health is a priority. Following these instructions, attending your colonoscopy and specialist appointments, and continuing with your smoking cessation efforts are crucial steps in managing your conditions and improving your overall well-being.

## 2023-06-01 NOTE — Assessment & Plan Note (Signed)
His left elbow pain is likely due to tennis elbow from repetitive lifting. We recommend an elbow strap for the left elbow and advise ibuprofen for pain relief. Consider moving the elbow sleeve to the right elbow.

## 2023-06-01 NOTE — Assessment & Plan Note (Addendum)
Mesh repaired, but with pain

## 2023-06-01 NOTE — Assessment & Plan Note (Addendum)
Lymphadenopathy is noted in the left upper quadrant of his abdomen, alongside chronic leukocytosis with a white blood cell count consistently between 11-17 for the past 15 years. We will refer him to a hematologist/oncologist for further evaluation. Risk factors prior abdomen surgery, and smoking history.

## 2023-06-01 NOTE — Assessment & Plan Note (Addendum)
Resolved seemingly Wt Readings from Last 10 Encounters:  06/01/23 187 lb 12.8 oz (85.2 kg)  05/26/23 190 lb (86.2 kg)  07/06/14 183 lb 6.4 oz (83.2 kg)  06/30/14 184 lb (83.5 kg)   Removing from problem list

## 2023-06-02 LAB — ANA W/REFLEX IF POSITIVE: Anti Nuclear Antibody (ANA): NEGATIVE

## 2023-06-02 NOTE — Assessment & Plan Note (Signed)
Lab Results  Component Value Date/Time   WBC 10.2 06/01/2023 10:06 AM   WBC 13.9 (H) 05/26/2023 01:43 PM   WBC 10.7 (H) 06/30/2014 05:00 PM   WBC 17.7 (H) 07/28/2011 04:13 PM   WBC 11.3 (H) 02/25/2009 08:09 AM  Due to over a decade of high white blood cell(s), and high in emergency room, and LAD in CT abdomen/pelvis we launched evaluation and referral heme... .then labwork returned white blood cell(s) as normal.

## 2023-06-02 NOTE — Assessment & Plan Note (Signed)
Resolved, grief related

## 2023-06-03 ENCOUNTER — Encounter: Payer: Self-pay | Admitting: Internal Medicine

## 2023-06-03 DIAGNOSIS — G8929 Other chronic pain: Secondary | ICD-10-CM | POA: Insufficient documentation

## 2023-06-03 DIAGNOSIS — E785 Hyperlipidemia, unspecified: Secondary | ICD-10-CM | POA: Insufficient documentation

## 2023-06-03 NOTE — Progress Notes (Signed)
Reviewed lab results from 06/01/2023. Key findings: trace hematuria, mild dyslipidemia, elevated inflammatory markers. Normal WBC count. Urology and Hematology/Oncology referrals initiated. Continue current treatments including Miralax and nicotine replacement therapy. Maintain colonoscopy appointment. Follow-up scheduled for 06/15/2023 to review all results and specialist recommendations. See Patient Message for detailed explanation and instructions.

## 2023-06-04 LAB — URINE CYTOLOGY ANCILLARY ONLY
Chlamydia: NEGATIVE
Comment: NEGATIVE
Comment: NEGATIVE
Comment: NORMAL
Neisseria Gonorrhea: NEGATIVE
Trichomonas: NEGATIVE

## 2023-06-05 ENCOUNTER — Telehealth: Payer: Self-pay | Admitting: Oncology

## 2023-06-05 LAB — TRYPTASE: Tryptase: 9.6 ug/L (ref <11.0)

## 2023-06-05 LAB — CELIAC DISEASE COMPREHENSIVE PANEL WITH REFLEXES
(tTG) Ab, IgA: <1 U/mL
Immunoglobulin A: 493 mg/dL — ABNORMAL HIGH (ref 47–310)

## 2023-06-05 LAB — RHEUMATOID FACTOR: Rheumatoid fact SerPl-aCnc: 48 [IU]/mL — ABNORMAL HIGH (ref <14)

## 2023-06-05 LAB — HEPATITIS B SURFACE ANTIBODY,QUALITATIVE: Hep B S Ab: REACTIVE — AB

## 2023-06-05 LAB — IGG, IGA, IGM
IgG (Immunoglobin G), Serum: 1586 mg/dL (ref 600–1640)
IgM, Serum: 101 mg/dL (ref 50–300)
Immunoglobulin A: 511 mg/dL — ABNORMAL HIGH (ref 47–310)

## 2023-06-05 LAB — HIV ANTIBODY (ROUTINE TESTING W REFLEX): HIV 1&2 Ab, 4th Generation: NONREACTIVE

## 2023-06-05 LAB — URINE CULTURE
MICRO NUMBER:: 15584703
Result:: NO GROWTH
SPECIMEN QUALITY:: ADEQUATE

## 2023-06-05 LAB — CYCLIC CITRUL PEPTIDE ANTIBODY, IGG: Cyclic Citrullin Peptide Ab: <16 U

## 2023-06-05 LAB — PATHOLOGIST SMEAR REVIEW

## 2023-06-05 LAB — CEA: CEA: <2 ng/mL

## 2023-06-05 LAB — HEPATITIS B SURFACE ANTIGEN: Hepatitis B Surface Ag: NONREACTIVE

## 2023-06-05 NOTE — Telephone Encounter (Signed)
Scheduled appointments per referral. Patient is aware of the appointment time and date as well as the address. Patient was informed to arrive 10-15 minutes prior with updated insurance information. All questions were answered.

## 2023-06-06 NOTE — Progress Notes (Signed)
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

## 2023-06-07 LAB — HM COLONOSCOPY

## 2023-06-08 NOTE — Telephone Encounter (Signed)
Informed patient of lab results/notes. Sent my chart message with Urology referral contact information. Scheduled for an follow-up visit on 10/25.

## 2023-06-12 LAB — TSH RFX ON ABNORMAL TO FREE T4

## 2023-06-12 NOTE — Addendum Note (Signed)
Addended by: Donzetta Starch on: 06/12/2023 02:19 PM   Modules accepted: Orders

## 2023-06-13 ENCOUNTER — Other Ambulatory Visit: Payer: 59

## 2023-06-13 DIAGNOSIS — Z8719 Personal history of other diseases of the digestive system: Secondary | ICD-10-CM

## 2023-06-13 DIAGNOSIS — G4720 Circadian rhythm sleep disorder, unspecified type: Secondary | ICD-10-CM

## 2023-06-13 DIAGNOSIS — D72829 Elevated white blood cell count, unspecified: Secondary | ICD-10-CM

## 2023-06-13 DIAGNOSIS — R634 Abnormal weight loss: Secondary | ICD-10-CM

## 2023-06-13 DIAGNOSIS — M25522 Pain in left elbow: Secondary | ICD-10-CM

## 2023-06-13 DIAGNOSIS — F17200 Nicotine dependence, unspecified, uncomplicated: Secondary | ICD-10-CM

## 2023-06-14 LAB — TSH RFX ON ABNORMAL TO FREE T4: TSH: 0.844 u[IU]/mL (ref 0.450–4.500)

## 2023-06-14 LAB — HEPATITIS C ANTIBODY: Hepatitis C Ab: NONREACTIVE

## 2023-06-15 ENCOUNTER — Ambulatory Visit: Payer: 59 | Admitting: Internal Medicine

## 2023-06-15 VITALS — BP 124/80 | HR 60 | Temp 98.2°F | Ht 74.0 in | Wt 191.8 lb

## 2023-06-15 DIAGNOSIS — R3129 Other microscopic hematuria: Secondary | ICD-10-CM | POA: Diagnosis not present

## 2023-06-15 DIAGNOSIS — E785 Hyperlipidemia, unspecified: Secondary | ICD-10-CM | POA: Diagnosis not present

## 2023-06-15 DIAGNOSIS — Z23 Encounter for immunization: Secondary | ICD-10-CM | POA: Diagnosis not present

## 2023-06-15 DIAGNOSIS — D72829 Elevated white blood cell count, unspecified: Secondary | ICD-10-CM | POA: Diagnosis not present

## 2023-06-15 DIAGNOSIS — K259 Gastric ulcer, unspecified as acute or chronic, without hemorrhage or perforation: Secondary | ICD-10-CM

## 2023-06-15 DIAGNOSIS — M5442 Lumbago with sciatica, left side: Secondary | ICD-10-CM

## 2023-06-15 DIAGNOSIS — Z122 Encounter for screening for malignant neoplasm of respiratory organs: Secondary | ICD-10-CM

## 2023-06-15 DIAGNOSIS — F17200 Nicotine dependence, unspecified, uncomplicated: Secondary | ICD-10-CM

## 2023-06-15 DIAGNOSIS — G8929 Other chronic pain: Secondary | ICD-10-CM

## 2023-06-15 LAB — CBC WITH DIFFERENTIAL/PLATELET
Basophils Absolute: 0.1 10*3/uL (ref 0.0–0.1)
Basophils Relative: 1.2 % (ref 0.0–3.0)
Eosinophils Absolute: 0.8 10*3/uL — ABNORMAL HIGH (ref 0.0–0.7)
Eosinophils Relative: 7.8 % — ABNORMAL HIGH (ref 0.0–5.0)
HCT: 43.4 % (ref 39.0–52.0)
Hemoglobin: 14 g/dL (ref 13.0–17.0)
Lymphocytes Relative: 24.8 % (ref 12.0–46.0)
Lymphs Abs: 2.4 10*3/uL (ref 0.7–4.0)
MCHC: 32.2 g/dL (ref 30.0–36.0)
MCV: 93.5 fL (ref 78.0–100.0)
Monocytes Absolute: 0.7 10*3/uL (ref 0.1–1.0)
Monocytes Relative: 7.6 % (ref 3.0–12.0)
Neutro Abs: 5.7 10*3/uL (ref 1.4–7.7)
Neutrophils Relative %: 58.6 % (ref 43.0–77.0)
Platelets: 194 10*3/uL (ref 150.0–400.0)
RBC: 4.64 Mil/uL (ref 4.22–5.81)
RDW: 14.9 % (ref 11.5–15.5)
WBC: 9.8 10*3/uL (ref 4.0–10.5)

## 2023-06-15 LAB — C-REACTIVE PROTEIN: CRP: <1 mg/dL (ref 0.5–20.0)

## 2023-06-16 ENCOUNTER — Encounter: Payer: Self-pay | Admitting: Internal Medicine

## 2023-06-16 NOTE — Progress Notes (Signed)
[facility]: [phone]   -- Medical Office Visit --  Patient:  Timothy Borowsky Sheets Sr.      Age: 57 y.o.       Sex:  male  Date:   06/15/2023 Today's Healthcare Provider: Lula Olszewski, MD  =============================================================================== Assessment Plan    Assessment & Plan Multiple gastric ulcers Their symptoms have improved with Protonix 40mg , likely due to NSAID use. We will continue Protonix 40mg  and advise them to avoid NSAIDs for the next 8 weeks to allow the ulcers to heal. He reports dramatic improvement at this time  Leukocytosis, unspecified type Their previously elevated white blood cell count has normalized, possibly linked to gastric ulcers. We will order another white blood cell count today for further evaluation and continue with the hematology referral unless it remains normal on recheck. Dyslipidemia Their cholesterol is mildly elevated, but they have initiated dietary changes. We will recheck cholesterol in 3 months to assess the effectiveness of these changes. Other microscopic hematuria Trace hematuria was found in their lab work, with no urology consult yet. Encouraged patient to to let us know if they haven't scheduled by next week. Chronic low back pain with left-sided sciatica, unspecified back pain laterality Their pain is managed with a back brace and cushion. We will provide a work Physicist, medical for ergonomic accommodations. Need for influenza vaccination  Encounter for screening for malignant neoplasm of lung in current smoker with 30 pack year history or greater  We will order a lung cancer screening CT due to their significant smoking history, schedule their annual wellness exam in 3 months, and administer a flu shot today.     Encounter orders          Ordered    Low Dose CT Chest w/o Contrast for Lung Cancer Screening [[id number]]       Comments: Specimen [id number]: [employer]  Epic Wt:191aware of $75 of no show  feeNo implants No spinal Stimulator/ No body injector/ No glucose or heart monitorNo  needsSpoke w Patient [facility]    06/15/23 0938               Diagnoses and all orders for this visit: Need for influenza vaccination -     Flu vaccine trivalent PF, 6mos and older(Flulaval,Afluria,Fluarix,Fluzone) Multiple gastric ulcers Leukocytosis, unspecified type -     CBC with Differential/Platelet -     C-reactive protein Dyslipidemia Encounter for screening for malignant neoplasm of lung in current smoker with 30 pack year history or greater -     Low Dose CT Chest w/o Contrast for Lung Cancer Screening [[id number]]; Future Other microscopic hematuria Chronic low back pain with left-sided sciatica, unspecified back pain laterality   Subjective   57 y.o. male who has Leucocytosis; TOBACCO USER; Diaphragmatic hernia; CORNS AND CALLOSITIES; DISC DISEASE, CERVICAL; History of umbilical hernia; Hematuria; Left elbow pain; Abdominal lymphadenopathy; Acute periumbilical pain; Dyslipidemia; and Chronic low back pain with left-sided sciatica on their problem list. Main reasons for visit/main concerns/chief complaint: 2 week follow-up  ------------------------------------------------------------------------------------------------------------------------ AI-Extracted: Discussed the use of AI scribe software for clinical note transcription with the patient, who gave verbal consent to proceed.  History of Present Illness   The patient, with a history of gastric ulcers, reports significant improvement in their stomach issues following treatment. They are currently on a regimen of Protonix and Kerafoam, which has effectively managed their symptoms. The ulcers were localized to the stomach, with no esophageal involvement reported.  The patient also reports trace hematuria,  for which a referral to a urologist has been made. They have not yet received a call for an appointment.  In addition to these issues, the  patient has been diagnosed with mild dyslipidemia. They have made dietary changes in response to this diagnosis.  The patient's inflammatory markers were found to be elevated, potentially due to the presence of the gastric ulcers. They have a normal white blood cell count, indicating no current infection. The patient acknowledges the use of ibuprofen and Aleve, which could have contributed to the development of the ulcers. They have been advised to avoid these medications for the next eight weeks to allow the ulcers to heal.  The patient was referred to hematology oncology due to a consistently high white blood cell count over a decade. However, a recent test showed a return to normal levels. The patient has an upcoming appointment with the specialist.  The patient also reports some joint inflammation, specifically in the right elbow. They have been using a strap, which has been helpful in managing the discomfort.  The patient has a history of smoking but has recently quit and is currently using a nicotine patch. They have also been proactive in managing their health, making positive changes and showing commitment to their wellbeing.  The patient has been experiencing chronic low back pain but does not feel the need for a referral to a pain management specialist. They have been using a back brace and cushion for support.  The patient's brother recently got married, and they have a good relationship with their ex-wife, with whom they have four children. They have been using their ex-wife's medical background to help understand their medical care.      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  Problem list overviews that were updated at today's visit:No problems updated.  Med reconciliation: Current Outpatient Medications on File Prior to Visit  Medication Sig    cyclobenzaprine (FLEXERIL) 10 MG tablet Take 1 tablet (10 mg total) by mouth 3 (three) times daily as needed for muscle spasms.   dicyclomine (BENTYL) 20 MG tablet Take 1 tablet (20 mg total) by mouth 2 (two) times daily.   HYDROcodone-acetaminophen (NORCO/VICODIN) 5-325 MG tablet Take 1 tablet by mouth every 6 (six) hours as needed.   ibuprofen (ADVIL) 800 MG tablet Take 800 mg by mouth every 6 (six) hours as needed.   nicotine (NICODERM CQ - DOSED IN MG/24 HOURS) 14 mg/24hr patch RX #1 Weeks 1-6: 14 mg x 1 patch daily. Wear for 24 hours. If you have sleep disturbances, remove at bedtime.   nicotine (NICODERM CQ - DOSED IN MG/24 HR) 7 mg/24hr patch RX #2 Weeks 7-8: 7 mg x 1 patch dailyWear for 24 hours. If you have sleep disturbances, remove at bedtime..   nicotine polacrilex (COMMIT) 2 MG lozenge RX #1 Weeks 1-6: 1 lozenge every 1-2 hours. Use at least 9 lozenges per day for the first 6 weeks. Max 20 lozenges per day.   nicotine polacrilex (COMMIT) 2 MG lozenge RX #2 Weeks 7-9: 1 lozenge every 2-4 hours.Max 20 lozenges per day.   nicotine polacrilex (COMMIT) 2 MG lozenge RX #3 Weeks 10-12: 1 lozenge every 4-8 hours.Max 20 lozenges per day.   nicotine polacrilex (NICORETTE) 2 MG gum RX #1 Weeks 1-6: 1 piece every 1-2 hours.Use at least 9 pieces of gum per day for the first 6 weeks. Max  24 pieces per day.   nicotine polacrilex (NICORETTE) 2 MG gum RX #2 Weeks 7-9: 1 piece every 2-4 hours.Max 24 pieces per day.   nicotine polacrilex (NICORETTE) 2 MG gum RX #3 Weeks 10-12: 1 piece every 4-8 hours.Max 24 pieces per day.   ondansetron (ZOFRAN-ODT) 4 MG disintegrating tablet Take 1 tablet (4 mg total) by mouth every 8 (eight) hours as needed.   amoxicillin (AMOXIL) 500 MG capsule Take 500 mg by mouth 3 (three) times daily. (Patient not taking: Reported on 06/15/2023)   No current facility-administered medications on file prior to visit.  There are no discontinued medications.   Objective   Physical  Exam  BP 124/80 (BP Location: Right Arm, Patient Position: Sitting)   Pulse 60   Temp 98.2 F (36.8 C) (Temporal)   Ht 6\' 2"  (1.88 m)   Wt 191 lb 12.8 oz (87 kg)   SpO2 100%   BMI 24.63 kg/m  Wt Readings from Last 10 Encounters:  06/15/23 191 lb 12.8 oz (87 kg)  06/01/23 187 lb 12.8 oz (85.2 kg)  05/26/23 190 lb (86.2 kg)  07/06/14 183 lb 6.4 oz (83.2 kg)  06/30/14 184 lb (83.5 kg)  Vital signs reviewed.  Nursing notes reviewed. Weight trend reviewed. General Appearance:  No acute distress appreciable.   Well-groomed, healthy-appearing male.  Well proportioned with no abnormal fat distribution.  Good muscle tone. Pulmonary:  Normal work of breathing at rest, no respiratory distress apparent. SpO2: 100 %  Musculoskeletal: All extremities are intact.  Neurological:  Awake, alert, oriented, and engaged.  No obvious focal neurological deficits or cognitive impairments.  Sensorium seems unclouded.   Speech is clear and coherent with logical content. Psychiatric:  Appropriate mood, pleasant and cooperative demeanor, thoughtful and engaged during the exam  Results   LABS Urinalysis: Trace hematuria (06/08/2023) Lipid Panel: Mild dyslipidemia Inflammatory Markers: Elevated WBC: High normal (05/26/2023) Rheumatoid Factor: Elevated HDL: 40.3        Results for orders placed or performed in visit on 06/15/23  CBC with Differential/Platelet  Result Value Ref Range   WBC 9.8 4.0 - 10.5 K/uL   RBC 4.64 4.22 - 5.81 Mil/uL   Hemoglobin 14.0 13.0 - 17.0 g/dL   HCT 16.1 09.6 - 04.5 %   MCV 93.5 78.0 - 100.0 fl   MCHC 32.2 30.0 - 36.0 g/dL   RDW 40.9 81.1 - 91.4 %   Platelets 194.0 150.0 - 400.0 K/uL   Neutrophils Relative % 58.6 43.0 - 77.0 %   Lymphocytes Relative 24.8 12.0 - 46.0 %   Monocytes Relative 7.6 3.0 - 12.0 %   Eosinophils Relative 7.8 (H) 0.0 - 5.0 %   Basophils Relative 1.2 0.0 - 3.0 %   Neutro Abs 5.7 1.4 - 7.7 K/uL   Lymphs Abs 2.4 0.7 - 4.0 K/uL   Monocytes  Absolute 0.7 0.1 - 1.0 K/uL   Eosinophils Absolute 0.8 (H) 0.0 - 0.7 K/uL   Basophils Absolute 0.1 0.0 - 0.1 K/uL  C-reactive protein  Result Value Ref Range   CRP <1.0 0.5 - 20.0 mg/dL    Office Visit on 78/29/5621  Component Date Value   WBC 06/15/2023 9.8    RBC 06/15/2023 4.64    Hemoglobin 06/15/2023 14.0    HCT 06/15/2023 43.4    MCV 06/15/2023 93.5    MCHC 06/15/2023 32.2    RDW 06/15/2023 14.9    Platelets 06/15/2023 194.0    Neutrophils Relative % 06/15/2023 58.6  Lymphocytes Relative 06/15/2023 24.8    Monocytes Relative 06/15/2023 7.6    Eosinophils Relative 06/15/2023 7.8 (H)    Basophils Relative 06/15/2023 1.2    Neutro Abs 06/15/2023 5.7    Lymphs Abs 06/15/2023 2.4    Monocytes Absolute 06/15/2023 0.7    Eosinophils Absolute 06/15/2023 0.8 (H)    Basophils Absolute 06/15/2023 0.1    CRP 06/15/2023 <1.0   Lab on 06/13/2023  Component Date Value   TSH 06/13/2023 0.844    Hepatitis C Ab 06/13/2023 NON-REACTIVE   Office Visit on 06/01/2023  Component Date Value   Color, Urine 06/01/2023 YELLOW    APPearance 06/01/2023 CLEAR    Specific Gravity, Urine 06/01/2023 >=1.030 (A)    pH 06/01/2023 6.0    Total Protein, Urine 06/01/2023 30 (A)    Urine Glucose 06/01/2023 NEGATIVE    Ketones, ur 06/01/2023 NEGATIVE    Bilirubin Urine 06/01/2023 NEGATIVE    Hgb urine dipstick 06/01/2023 TRACE-INTACT (A)    Urobilinogen, UA 06/01/2023 0.2    Leukocytes,Ua 06/01/2023 NEGATIVE    Nitrite 06/01/2023 NEGATIVE    WBC, UA 06/01/2023 0-2/hpf    RBC / HPF 06/01/2023 0-2/hpf    Mucus, UA 06/01/2023 Presence of (A)    Squamous Epithelial / HPF 06/01/2023 Rare(0-4/hpf)    Neisseria Gonorrhea 06/01/2023 Negative    Chlamydia 06/01/2023 Negative    Trichomonas 06/01/2023 Negative    Comment 06/01/2023 Normal Reference Range Trichomonas - Negative    Comment 06/01/2023 Normal Reference Ranger Chlamydia - Negative    Comment 06/01/2023 Normal Reference Range Neisseria  Gonorrhea - Negative    Path Review 06/01/2023     WBC 06/01/2023 10.2    RBC 06/01/2023 4.87    Hemoglobin 06/01/2023 14.8    HCT 06/01/2023 45.1    MCV 06/01/2023 92.6    MCHC 06/01/2023 32.8    RDW 06/01/2023 14.8    Platelets 06/01/2023 203.0    Neutrophils Relative % 06/01/2023 59.5    Lymphocytes Relative 06/01/2023 23.1    Monocytes Relative 06/01/2023 8.4    Eosinophils Relative 06/01/2023 7.7 (H)    Basophils Relative 06/01/2023 1.3    Neutro Abs 06/01/2023 6.1    Lymphs Abs 06/01/2023 2.4    Monocytes Absolute 06/01/2023 0.9    Eosinophils Absolute 06/01/2023 0.8 (H)    Basophils Absolute 06/01/2023 0.1    Sodium 06/01/2023 137    Potassium 06/01/2023 4.7    Chloride 06/01/2023 103    CO2 06/01/2023 29    Glucose, Bld 06/01/2023 91    BUN 06/01/2023 10    Creatinine, Ser 06/01/2023 1.13    Total Bilirubin 06/01/2023 0.7    Alkaline Phosphatase 06/01/2023 78    AST 06/01/2023 23    ALT 06/01/2023 14    Total Protein 06/01/2023 6.6    Albumin 06/01/2023 4.0    GFR 06/01/2023 72.72    Calcium 06/01/2023 9.3    MICRO NUMBER: 06/01/2023 [id number]    SPECIMEN QUALITY: 06/01/2023 Adequate    Sample Source 06/01/2023 NOT GIVEN    STATUS: 06/01/2023 FINAL    Result: 06/01/2023 No Growth    Sed Rate 06/01/2023 23 (H)    CRP 06/01/2023 <1.0    PSA 06/01/2023 0.80    HIV 1&2 Ab, 4th Generati* 06/01/2023 NON-REACTIVE    Vitamin B-12 06/01/2023 492    Folate 06/01/2023 7.3    Hepatitis B Surface Ag 06/01/2023 NON-REACTIVE    Hep B S Ab 06/01/2023 REACTIVE (A)    Cholesterol 06/01/2023 156  Triglycerides 06/01/2023 82.0    HDL 06/01/2023 37.20 (L)    VLDL 06/01/2023 16.4    LDL Cholesterol 06/01/2023 102 (H)    Total CHOL/HDL Ratio 06/01/2023 4    NonHDL 06/01/2023 118.68    Immunoglobulin A 06/01/2023 511 (H)    IgG (Immunoglobin G), Se* 06/01/2023 1,586    IgM, Serum 06/01/2023 101    Anti Nuclear Antibody (A* 06/01/2023 Negative    Rheumatoid fact SerPl-aC*  06/01/2023 48 (H)    Cyclic Citrullin Peptide* 06/01/2023 <16    INTERPRETATION 06/01/2023     (tTG) Ab, IgA 06/01/2023 <1.0    Immunoglobulin A 06/01/2023 493 (H)    Tryptase 06/01/2023 9.6    Uric Acid, Serum 06/01/2023 4.1    CEA 06/01/2023 <2.0    TSH 06/01/2023 CANCELED   Admission on 05/26/2023, Discharged on 05/26/2023  Component Date Value   Lipase 05/26/2023 10 (L)    Sodium 05/26/2023 136    Potassium 05/26/2023 3.6    Chloride 05/26/2023 100    CO2 05/26/2023 24    Glucose, Bld 05/26/2023 111 (H)    BUN 05/26/2023 11    Creatinine, Ser 05/26/2023 1.08    Calcium 05/26/2023 9.7    Total Protein 05/26/2023 8.0    Albumin 05/26/2023 4.3    AST 05/26/2023 15    ALT 05/26/2023 8    Alkaline Phosphatase 05/26/2023 85    Total Bilirubin 05/26/2023 0.7    GFR, Estimated 05/26/2023 >60    Anion gap 05/26/2023 12    WBC 05/26/2023 13.9 (H)    RBC 05/26/2023 5.39    Hemoglobin 05/26/2023 16.4    HCT 05/26/2023 47.7    MCV 05/26/2023 88.5    MCH 05/26/2023 30.4    MCHC 05/26/2023 34.4    RDW 05/26/2023 14.4    Platelets 05/26/2023 219    nRBC 05/26/2023 0.0    Color, Urine 05/26/2023 YELLOW    APPearance 05/26/2023 CLEAR    Specific Gravity, Urine 05/26/2023 1.035 (H)    pH 05/26/2023 6.0    Glucose, UA 05/26/2023 NEGATIVE    Hgb urine dipstick 05/26/2023 MODERATE (A)    Bilirubin Urine 05/26/2023 NEGATIVE    Ketones, ur 05/26/2023 15 (A)    Protein, ur 05/26/2023 30 (A)    Nitrite 05/26/2023 NEGATIVE    Leukocytes,Ua 05/26/2023 NEGATIVE    RBC / HPF 05/26/2023 11-20    WBC, UA 05/26/2023 0-5    Bacteria, UA 05/26/2023 NONE SEEN    Squamous Epithelial / HPF 05/26/2023 0-5    Mucus 05/26/2023 PRESENT    No image results found.   CT ABDOMEN PELVIS W CONTRAST  Result Date: 05/26/2023 CLINICAL DATA:  Abdominal pain and constipation for a week. Emesis last night and again this morning. History of previous hernia repair EXAM: CT ABDOMEN AND PELVIS WITH CONTRAST  TECHNIQUE: Multidetector CT imaging of the abdomen and pelvis was performed using the standard protocol following bolus administration of intravenous contrast. RADIATION DOSE REDUCTION: This exam was performed according to the departmental dose-optimization program which includes automated exposure control, adjustment of the mA and/or kV according to patient size and/or use of iterative reconstruction technique. CONTRAST:  OMNIPAQUE IOHEXOL 300 MG/ML  SOLN COMPARISON:  None Available. FINDINGS: Lower chest: There is some linear opacity lung bases likely scar or atelectasis. No pleural effusion. Hepatobiliary: Liver some scattered subcentimeter low-attenuation lesions which are too small to completely characterize. Statistically likely benign cystic lesions and no specific imaging follow-up. Patent portal vein. The gallbladder is  present. Pancreas: Unremarkable. No pancreatic ductal dilatation or surrounding inflammatory changes. Spleen: Normal in size without focal abnormality. Adrenals/Urinary Tract: Adrenal glands are unremarkable. Kidneys are normal, without renal calculi, focal lesion, or hydronephrosis. Bladder is unremarkable. Stomach/Bowel: Slight fold thickening and wall thickening along the body and antrum of the stomach. Please correlate with any symptoms. Further workup as clinically appropriate. Otherwise on this non oral contrast exam the small bowel is nondilated. Large bowel has a normal course and caliber with scattered colonic stool. Normal appendix extends medial to the cecum in the right hemipelvis. Vascular/Lymphatic: Normal caliber aorta with scattered diffuse vascular calcifications along the aorta and branch vessels. 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 Few prominent left upper quadrant mesenteric nodes identified such as series 2, image 24, image 28. Nonpathologic by size criteria but more numerous than usually seen. Also a prominent left external iliac chain lymph node  with short axis of 9 mm on series 2, image 72. Reproductive: Prostate is unremarkable. Other: No abdominal wall hernia or abnormality. No abdominopelvic ascites. Musculoskeletal: Scattered degenerative changes of the spine and pelvis. Bridging osteophytes along the left sacroiliac joint. IMPRESSION: Mild fold thickening along the body and antrum of the stomach. Few reactive nodes in the left upper quadrant of the abdomen along the mesentery. Please correlate with any particular symptoms and further workup as clinically appropriate. No bowel obstruction.  Scattered stool.  Normal appendix. Electronically Signed   By: Karen Kays M.D.   On: 05/26/2023 16:58    No results found.    Additional Info: This encounter employed real-time, collaborative documentation. The patient actively reviewed and updated their medical record on a shared screen, ensuring transparency and facilitating joint problem-solving for the problem list, overview, and plan. This approach promotes accurate, informed care. The treatment plan was discussed and reviewed in detail, including medication safety, potential side effects, and all patient questions. We confirmed understanding and comfort with the plan. Follow-up instructions were established, including contacting the office for any concerns, returning if symptoms worsen, persist, or new symptoms develop, and precautions for potential emergency department visits.

## 2023-06-16 NOTE — Assessment & Plan Note (Signed)
Their previously elevated white blood cell count has normalized, possibly linked to gastric ulcers. We will order another white blood cell count today for further evaluation and continue with the hematology referral unless it remains normal on recheck.

## 2023-06-16 NOTE — Assessment & Plan Note (Signed)
Their cholesterol is mildly elevated, but they have initiated dietary changes. We will recheck cholesterol in 3 months to assess the effectiveness of these changes.

## 2023-06-16 NOTE — Patient Instructions (Signed)
VISIT SUMMARY:  During today's visit, we discussed several health issues and reviewed your progress. Your stomach issues have improved with your current medication, and we addressed your trace hematuria, dyslipidemia, leukocytosis, smoking cessation, and chronic low back pain. We also discussed general health maintenance, including upcoming screenings and vaccinations.  YOUR PLAN:  -GASTRIC ULCERS: Gastric ulcers are sores that develop on the lining of the stomach. Your symptoms have improved with Protonix 40mg , and you should continue this medication. Avoid NSAIDs like ibuprofen and Aleve for the next 8 weeks to allow the ulcers to heal.  -HEMATURIA: Hematuria is the presence of blood in the urine. We found trace amounts in your lab work and have referred you to a urologist for further evaluation.  -DYSLIPIDEMIA: Dyslipidemia means having an abnormal amount of lipids (cholesterol and/or fat) in your blood. You have made dietary changes, and we will recheck your cholesterol in 3 months to see if these changes are effective.  -LEUKOCYTOSIS: Leukocytosis is an elevated white blood cell count. Your count has normalized, possibly due to the healing of your gastric ulcers. We will order another white blood cell count today and continue with your hematology referral.  -SMOKING CESSATION: You have successfully quit smoking and are using nicotine patches. Continue your efforts to stay smoke-free, and we will support you in this journey.  -CHRONIC LOW BACK PAIN: Chronic low back pain is long-term pain in the lower back. You are managing it with a back brace and cushion. We will provide a work Physicist, medical for ergonomic accommodations.  -GENERAL HEALTH MAINTENANCE: We will order a lung cancer screening CT due to your smoking history, schedule your annual wellness exam in 3 months, and administer a flu shot today.  INSTRUCTIONS:  Please follow up with the urologist for your hematuria evaluation. We will recheck  your cholesterol in 3 months. Continue using the nicotine patches and avoid NSAIDs for the next 8 weeks. We will also order another white blood cell count today and provide a work letter for ergonomic accommodations. Your lung cancer screening CT will be scheduled, and you will receive a flu shot today.

## 2023-06-16 NOTE — Assessment & Plan Note (Signed)
Their pain is managed with a back brace and cushion. We will provide a work Physicist, medical for ergonomic accommodations.

## 2023-06-16 NOTE — Assessment & Plan Note (Signed)
Trace hematuria was found in their lab work, with no urology consult yet. Encouraged patient to to let us know if they haven't scheduled by next week.

## 2023-06-18 ENCOUNTER — Ambulatory Visit
Admission: RE | Admit: 2023-06-18 | Discharge: 2023-06-18 | Disposition: A | Discharge status: Home / Self Care | Payer: 59 | Source: Ambulatory Visit | Attending: Internal Medicine | Admitting: Internal Medicine

## 2023-06-18 DIAGNOSIS — F17200 Nicotine dependence, unspecified, uncomplicated: Secondary | ICD-10-CM

## 2023-06-29 ENCOUNTER — Inpatient Hospital Stay: Payer: 59

## 2023-06-29 ENCOUNTER — Telehealth: Payer: Self-pay

## 2023-06-29 ENCOUNTER — Other Ambulatory Visit: Payer: Self-pay

## 2023-06-29 ENCOUNTER — Inpatient Hospital Stay: Payer: 59 | Attending: Oncology | Admitting: Oncology

## 2023-06-29 ENCOUNTER — Encounter: Payer: Self-pay | Admitting: Oncology

## 2023-06-29 VITALS — BP 132/77 | HR 75 | Temp 98.2°F | Resp 18 | Ht 74.5 in | Wt 191.2 lb

## 2023-06-29 DIAGNOSIS — R918 Other nonspecific abnormal finding of lung field: Secondary | ICD-10-CM | POA: Insufficient documentation

## 2023-06-29 DIAGNOSIS — B9681 Helicobacter pylori [H. pylori] as the cause of diseases classified elsewhere: Secondary | ICD-10-CM | POA: Insufficient documentation

## 2023-06-29 DIAGNOSIS — F1290 Cannabis use, unspecified, uncomplicated: Secondary | ICD-10-CM | POA: Diagnosis not present

## 2023-06-29 DIAGNOSIS — G8929 Other chronic pain: Secondary | ICD-10-CM | POA: Insufficient documentation

## 2023-06-29 DIAGNOSIS — D72829 Elevated white blood cell count, unspecified: Secondary | ICD-10-CM

## 2023-06-29 DIAGNOSIS — K31A19 Gastric intestinal metaplasia without dysplasia, unspecified site: Secondary | ICD-10-CM | POA: Insufficient documentation

## 2023-06-29 DIAGNOSIS — E785 Hyperlipidemia, unspecified: Secondary | ICD-10-CM | POA: Diagnosis not present

## 2023-06-29 DIAGNOSIS — F17210 Nicotine dependence, cigarettes, uncomplicated: Secondary | ICD-10-CM | POA: Diagnosis not present

## 2023-06-29 DIAGNOSIS — F17211 Nicotine dependence, cigarettes, in remission: Secondary | ICD-10-CM | POA: Insufficient documentation

## 2023-06-29 DIAGNOSIS — R911 Solitary pulmonary nodule: Secondary | ICD-10-CM | POA: Insufficient documentation

## 2023-06-29 DIAGNOSIS — D721 Eosinophilia, unspecified: Secondary | ICD-10-CM | POA: Diagnosis present

## 2023-06-29 DIAGNOSIS — A048 Other specified bacterial intestinal infections: Secondary | ICD-10-CM | POA: Insufficient documentation

## 2023-06-29 DIAGNOSIS — Z8711 Personal history of peptic ulcer disease: Secondary | ICD-10-CM | POA: Diagnosis not present

## 2023-06-29 DIAGNOSIS — Z79899 Other long term (current) drug therapy: Secondary | ICD-10-CM | POA: Insufficient documentation

## 2023-06-29 LAB — SEDIMENTATION RATE: Sed Rate: 11 mm/h (ref 0–16)

## 2023-06-29 LAB — COMPREHENSIVE METABOLIC PANEL
ALT: 9 U/L (ref 0–44)
AST: 15 U/L (ref 15–41)
Albumin: 3.9 g/dL (ref 3.5–5.0)
Alkaline Phosphatase: 88 U/L (ref 38–126)
Anion gap: 4 — ABNORMAL LOW (ref 5–15)
BUN: 13 mg/dL (ref 6–20)
CO2: 27 mmol/L (ref 22–32)
Calcium: 9.3 mg/dL (ref 8.9–10.3)
Chloride: 107 mmol/L (ref 98–111)
Creatinine, Ser: 1.18 mg/dL (ref 0.61–1.24)
GFR, Estimated: >60 mL/min (ref >60)
Glucose, Bld: 87 mg/dL (ref 70–99)
Potassium: 4.1 mmol/L (ref 3.5–5.1)
Sodium: 138 mmol/L (ref 135–145)
Total Bilirubin: 0.4 mg/dL (ref <1.2)
Total Protein: 7.4 g/dL (ref 6.5–8.1)

## 2023-06-29 LAB — CBC WITH DIFFERENTIAL/PLATELET
Abs Immature Granulocytes: 0.03 10*3/uL (ref 0.00–0.07)
Basophils Absolute: 0.1 10*3/uL (ref 0.0–0.1)
Basophils Relative: 1 %
Eosinophils Absolute: 1.1 10*3/uL — ABNORMAL HIGH (ref 0.0–0.5)
Eosinophils Relative: 11 %
HCT: 43.5 % (ref 39.0–52.0)
Hemoglobin: 14.7 g/dL (ref 13.0–17.0)
Immature Granulocytes: 0 %
Lymphocytes Relative: 29 %
Lymphs Abs: 2.8 10*3/uL (ref 0.7–4.0)
MCH: 30.9 pg (ref 26.0–34.0)
MCHC: 33.8 g/dL (ref 30.0–36.0)
MCV: 91.6 fL (ref 80.0–100.0)
Monocytes Absolute: 0.8 10*3/uL (ref 0.1–1.0)
Monocytes Relative: 8 %
Neutro Abs: 4.9 10*3/uL (ref 1.7–7.7)
Neutrophils Relative %: 51 %
Platelets: 195 10*3/uL (ref 150–400)
RBC: 4.75 MIL/uL (ref 4.22–5.81)
RDW: 14.9 % (ref 11.5–15.5)
WBC: 9.7 10*3/uL (ref 4.0–10.5)
nRBC: 0 % (ref 0.0–0.2)

## 2023-06-29 LAB — LACTATE DEHYDROGENASE: LDH: 132 U/L (ref 98–192)

## 2023-06-29 NOTE — Assessment & Plan Note (Signed)
-   Diagnosed on recent EGD on 06/07/2023.   -Currently undergoing treatment with antibiotics for 14 days. -Continue current treatment regimen.

## 2023-06-29 NOTE — Telephone Encounter (Signed)
Spoke with Timothy Hernandez for a STAT read on CT chest lung cancer screening,stated that Radiology would be made aware.

## 2023-06-29 NOTE — Assessment & Plan Note (Addendum)
-   Fluctuating white blood cell count over the years, with a recent decrease to normal levels. Eosinophils slightly elevated, possibly due to H. pylori infection.  -Clinical picture is not concerning for leukemia based on the long-standing nature of leukocytosis and absence of constitutional symptoms. -Discussed various possible etiologies for leukocytosis including chronic infection, inflammation, reactionary leukocytosis, bone marrow disorders etc.  In his case, history of smoking could be also the driving factor along with previously undetected H. pylori infection. -Labs today revealed normal white count of 9700 with persistent, mild eosinophilia with absolute eosinophil count of 1100. -Given his fluctuating leukocytosis, we will obtain BCR/ABL 1 testing to rule out any underlying MPN.  Will also obtain flow cytometry of peripheral blood for further evaluation, especially given persistent eosinophilia. -Follow-up phone call in 2 weeks to discuss results. -He was provided reassurance.  I will plan to see him again in 3 months for repeat labs.

## 2023-06-29 NOTE — Assessment & Plan Note (Signed)
-   A 7mm right upper lobe lung nodule identified on recent low-dose CT scan of the lungs. No prior scans for comparison. Patient recently quit smoking. -Repeat CT scan in 6 months (April 2025) to monitor nodule.  Patient stated that he will follow-up with his PCP regarding this.

## 2023-06-29 NOTE — Progress Notes (Signed)
[facility]  HEMATOLOGY CLINIC CONSULTATION NOTE    PATIENT NAME: Timothy Hernandez Sr.   MR#: [id number] DOB: 1965/11/17  DATE OF SERVICE: 06/29/2023  Patient Care Team: Lula Olszewski, MD as PCP - General (Internal Medicine)  REASON FOR CONSULTATION/ CHIEF COMPLAINT:  Evaluation of leukocytosis  HISTORY OF PRESENTING ILLNESS:   Timothy Hernandez Sr. is a 57 y.o. gentleman with past medical history of nicotine dependence, dyslipidemia, multiple gastric ulcers, H. pylori infection, colon polyps, chronic low back pain, was referred to our clinic for further evaluation of elevated WBC.  Discussed the use of AI scribe software for clinical note transcription with the patient, who gave verbal consent to proceed.   On routine labs with his PCP on 05/26/2023, white count was found to be elevated at 13,900 with ANC of 6100, eosinophil count is slightly elevated at 800.  Hemoglobin was 16.4, platelet count 219,000.  Previously patient's white count was slightly elevated at 10,700 in November 2015, 15,000-17,000 from 2009 until 2012.  Given fluctuating white count, a referral was sent to [place] for further evaluation.  Recently he had EGD and colonoscopy on 06/07/2023.  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  Negative for dysplasia.  Positive for H. pylori.  Descending colon polyp was removed and it showed polypoid colonic mucosa and negative for dysplasia.  Sigmoid colon polyp was found to be hyperplastic type.  On 06/01/2023, peripheral smear reviewed by pathologist showed eosinophilia.  No other major abnormalities.  White count was 10,200 that day, with otherwise normal differential.  ESR was 23 mm/h, slightly elevated.  CRP was undetectable.  B12, folate were within normal limits.  HIV, hepatitis C testing negative.  Hepatitis B testing showed evidence of immunity from vaccination.  ANA negative.  Rheumatoid factor was increased at  48 international units/mL.  However anti-CCP antibody was negative.  Celiac disease testing was negative.  CEA, TSH, uric acid within normal limits.  He is nearing the end of a 14-day course of antibiotics for recently diagnosed H. pylori. He also reports a history of stomach ulcers, which he is managing with Protonix.  In addition to these issues, the patient has a small lung nodule on the right upper side, discovered during a CT scan about 20 days ago. The nodule measures about 7 millimeters, and a follow-up scan is recommended in six months. The patient also has calcium deposits in the blood vessels supplying the heart, but reports no known heart problems or symptoms.  The patient quit smoking cigars three weeks ago, after a history of smoking about a pack a day. He also has a family history of cancer, with multiple relatives on his mother's side having had some form of the disease. His mother had multiple myeloma and passed away at the age of 79.  He denies recent infection. The last prescription antibiotics was more than 3 months ago  There is not reported symptoms of sinus congestion, cough, urinary frequency/urgency or dysuria, diarrhea, joint swelling/pain or abnormal skin rash.   He had no prior history or diagnosis of cancer. The patient has no prior diagnosis of autoimmune disease and was not prescribed corticosteroids related products.   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 He has a 7.5 pack-year smoking history. He does not have any smokeless tobacco history on file. He reports current alcohol use. He reports current drug use. Frequency: 3.00 times per week. Drug: Marijuana. Social History   Socioeconomic History   Marital status: Divorced    Spouse name: Not on file   Number of children: Not on file   Years of education: Not on file   Highest education level: Not on file  Occupational History   Not on file  Tobacco Use   Smoking status: Every Day    Current packs/day: 0.50    Average packs/day: 0.5 packs/day for 15.0 years (7.5 ttl pk-yrs)    Types: Cigars, Cigarettes   Smokeless tobacco: Not on file  Vaping Use   Vaping status: Never Used  Substance and Sexual Activity   Alcohol use: Yes    Comment: Social   Drug use: Yes    Frequency: 3.0 times per week    Types: Marijuana   Sexual activity: Yes    Birth control/protection: Condom  Other Topics Concern   Not on file  Social History Narrative   Not on file   Social Determinants of Health   Financial Resource Strain: Not on file  Food Insecurity: No Food Insecurity (06/29/2023)   Hunger Vital Sign    Worried About Running Out of Food in the Last Year: Never true    Ran Out of Food in the Last Year: Never true  Transportation Needs: No Transportation Needs (06/29/2023)   PRAPARE - Administrator, Civil Service (Medical): No    Lack of Transportation (Non-Medical): No  Physical Activity: Not on file  Stress: Not on file  Social Connections: Not on file  Intimate Partner Violence: Not At Risk (06/29/2023)   Humiliation, Afraid, Rape, and Kick questionnaire    Fear of Current or Ex-Partner: No    Emotionally Abused: No    Physically Abused: No    Sexually Abused: No    FAMILY HISTORY: Family History  Problem Relation Age of Onset   Cancer Mother        multiple myeloma, has had 2 stem cell transplants.   Cancer Maternal Grandmother    Cancer Maternal  Grandfather        lung   COPD Sister    Depression Sister    Diabetes Sister    Early death Sister     ALLERGIES:  He has No Known Allergies.  MEDICATIONS:  Current Outpatient Medications  Medication Sig Dispense Refill   amoxicillin (AMOXIL) 500 MG capsule 2 capsules Orally every 12 hrs for 14 days     clarithromycin (BIAXIN) 500 MG tablet 1 tablet Orally every 12 hrs for 14 days     nicotine (NICODERM CQ - DOSED IN MG/24 HOURS) 14 mg/24hr patch RX #1 Weeks 1-6: 14 mg x 1 patch daily. Wear for 24  hours. If you have sleep disturbances, remove at bedtime. 42 patch 0   nicotine (NICODERM CQ - DOSED IN MG/24 HR) 7 mg/24hr patch RX #2 Weeks 7-8: 7 mg x 1 patch dailyWear for 24 hours. If you have sleep disturbances, remove at bedtime.. 14 patch 0   nicotine polacrilex (COMMIT) 2 MG lozenge RX #1 Weeks 1-6: 1 lozenge every 1-2 hours. Use at least 9 lozenges per day for the first 6 weeks. Max 20 lozenges per day. 1008 lozenge 0   nicotine polacrilex (COMMIT) 2 MG lozenge RX #2 Weeks 7-9: 1 lozenge every 2-4 hours.Max 20 lozenges per day. 252 lozenge 0   nicotine polacrilex (COMMIT) 2 MG lozenge RX #3 Weeks 10-12: 1 lozenge every 4-8 hours.Max 20 lozenges per day. 126 lozenge 0   nicotine polacrilex (NICORETTE) 2 MG gum RX #1 Weeks 1-6: 1 piece every 1-2 hours.Use at least 9 pieces of gum per day for the first 6 weeks. Max 24 pieces per day. 1008 each 0   nicotine polacrilex (NICORETTE) 2 MG gum RX #2 Weeks 7-9: 1 piece every 2-4 hours.Max 24 pieces per day. 252 each 0   nicotine polacrilex (NICORETTE) 2 MG gum RX #3 Weeks 10-12: 1 piece every 4-8 hours.Max 24 pieces per day. 126 each 0   pantoprazole (PROTONIX) 40 MG tablet Take 40 mg by mouth daily. reported     polyethylene glycol (MIRALAX / GLYCOLAX) 17 g packet Take 17 g by mouth daily. Reported     cyclobenzaprine (FLEXERIL) 10 MG tablet Take 1 tablet (10 mg total) by mouth 3 (three) times daily as needed for muscle spasms. (Patient not  taking: Reported on 06/29/2023) 30 tablet 0   dicyclomine (BENTYL) 20 MG tablet Take 1 tablet (20 mg total) by mouth 2 (two) times daily. (Patient not taking: Reported on 06/29/2023) 20 tablet 0   HYDROcodone-acetaminophen (NORCO/VICODIN) 5-325 MG tablet Take 1 tablet by mouth every 6 (six) hours as needed. (Patient not taking: Reported on 06/29/2023)     ibuprofen (ADVIL) 800 MG tablet Take 800 mg by mouth every 6 (six) hours as needed. (Patient not taking: Reported on 06/29/2023)     ondansetron (ZOFRAN-ODT) 4 MG disintegrating tablet Take 1 tablet (4 mg total) by mouth every 8 (eight) hours as needed. (Patient not taking: Reported on 06/29/2023) 20 tablet 0   No current facility-administered medications for this visit.    REVIEW OF SYSTEMS:    Review of Systems - Oncology  All other pertinent systems were reviewed and were negative except as mentioned above.  PHYSICAL EXAMINATION:  ECOG PERFORMANCE STATUS: 1 - Symptomatic but completely ambulatory  Vitals:   06/29/23 1324  BP: 132/77  Pulse: 75  Resp: 18  Temp: 98.2 F (36.8 C)  SpO2: 98%   Filed Weights   06/29/23 1324  Weight: 191 lb 3.2 oz (86.7 kg)    Physical Exam Constitutional:      General: He is not in acute distress.    Appearance: Normal appearance.  HENT:     Head: Normocephalic and atraumatic.  Eyes:     General: No scleral icterus.    Conjunctiva/sclera: Conjunctivae normal.  Cardiovascular:     Rate and Rhythm: Normal rate and regular rhythm.     Heart sounds: Normal heart sounds.  Pulmonary:     Effort: Pulmonary effort is normal.     Breath sounds: Normal breath sounds.  Abdominal:     General: There is no distension.  Musculoskeletal:  Right lower leg: No edema.     Left lower leg: No edema.  Lymphadenopathy:     Cervical: No cervical adenopathy.  Neurological:     General: No focal deficit present.     Mental Status: He is alert and oriented to person, place, and time.  Psychiatric:         Mood and Affect: Mood normal.        Behavior: Behavior normal.        Thought Content: Thought content normal.     LABORATORY DATA:   I have reviewed the data as listed.  Results for orders placed or performed in visit on 06/29/23  Lactate dehydrogenase  Result Value Ref Range   LDH 132 98 - 192 U/L  Comprehensive metabolic panel  Result Value Ref Range   Sodium 138 135 - 145 mmol/L   Potassium 4.1 3.5 - 5.1 mmol/L   Chloride 107 98 - 111 mmol/L   CO2 27 22 - 32 mmol/L   Glucose, Bld 87 70 - 99 mg/dL   BUN 13 6 - 20 mg/dL   Creatinine, Ser 1.30 0.61 - 1.24 mg/dL   Calcium 9.3 8.9 - 86.5 mg/dL   Total Protein 7.4 6.5 - 8.1 g/dL   Albumin 3.9 3.5 - 5.0 g/dL   AST 15 15 - 41 U/L   ALT 9 0 - 44 U/L   Alkaline Phosphatase 88 38 - 126 U/L   Total Bilirubin 0.4 <1.2 mg/dL   GFR, Estimated >78 >46 mL/min   Anion gap 4 (L) 5 - 15  CBC with Differential/Platelet  Result Value Ref Range   WBC 9.7 4.0 - 10.5 K/uL   RBC 4.75 4.22 - 5.81 MIL/uL   Hemoglobin 14.7 13.0 - 17.0 g/dL   HCT 96.2 95.2 - 84.1 %   MCV 91.6 80.0 - 100.0 fL   MCH 30.9 26.0 - 34.0 pg   MCHC 33.8 30.0 - 36.0 g/dL   RDW 32.4 40.1 - 02.7 %   Platelets 195 150 - 400 K/uL   nRBC 0.0 0.0 - 0.2 %   Neutrophils Relative % 51 %   Neutro Abs 4.9 1.7 - 7.7 K/uL   Lymphocytes Relative 29 %   Lymphs Abs 2.8 0.7 - 4.0 K/uL   Monocytes Relative 8 %   Monocytes Absolute 0.8 0.1 - 1.0 K/uL   Eosinophils Relative 11 %   Eosinophils Absolute 1.1 (H) 0.0 - 0.5 K/uL   Basophils Relative 1 %   Basophils Absolute 0.1 0.0 - 0.1 K/uL   Immature Granulocytes 0 %   Abs Immature Granulocytes 0.03 0.00 - 0.07 K/uL  Results for orders placed or performed in visit on 06/29/23  HM COLONOSCOPY  Result Value Ref Range   HM Colonoscopy See Report (in chart) See Report (in chart), Patient Reported        RADIOGRAPHIC STUDIES:  I have personally reviewed the radiological images as listed and agree with the findings in the  report.  Low Dose CT Chest w/o Contrast for Lung Cancer Screening [[id number]]  Result Date: 06/29/2023 CLINICAL DATA:  Former smoker, quit 2 weeks ago, 28 pack-year history. EXAM: CT CHEST WITHOUT CONTRAST LOW-DOSE FOR LUNG CANCER SCREENING TECHNIQUE: Multidetector CT imaging of the chest was performed following the standard protocol without IV contrast. RADIATION DOSE REDUCTION: This exam was performed according to the departmental dose-optimization program which includes automated exposure control, adjustment of the mA and/or kV according to patient size and/or use of iterative reconstruction  technique. COMPARISON:  None Available. FINDINGS: Cardiovascular: Age advanced left anterior descending coronary artery calcification. Heart size normal. No pericardial effusion. Enlarged pulmonic trunk. Mediastinum/Nodes: No pathologically enlarged mediastinal or axillary lymph nodes. Hilar regions are difficult to definitively evaluate without IV contrast. Esophagus is grossly unremarkable. Lungs/Pleura: Centrilobular and paraseptal emphysema. Smoking related respiratory bronchiolitis. 7.1 mm nodule in the medial aspect of the anterior segment right upper lobe (3/149). Pulmonary nodules otherwise measure 5.6 mm or less in size. No suspicious pulmonary nodules. No pleural fluid. Airway is unremarkable. Upper Abdomen: Probable small cyst in the posterior right hepatic lobe. No specific follow-up necessary. Visualized portions of the liver, gallbladder, adrenal glands, kidneys, spleen, pancreas, stomach and bowel are otherwise grossly unremarkable. No upper abdominal adenopathy. Musculoskeletal: Degenerative changes in the spine. IMPRESSION: 1. 7.1 mm medial right upper lobe nodule. Lung-RADS 3, probably benign findings. Short-term follow-up in 6 months is recommended with repeat low-dose chest CT without contrast (please use the following order, "CT CHEST LCS NODULE FOLLOW-UP W/O CM"). These results will be called to the  ordering clinician or representative by the Radiologist Assistant, and communication documented in the PACS or [employer]. 2. Age advanced left anterior descending coronary artery calcification. 3. Enlarged pulmonic trunk, indicative of pulmonary arterial hypertension. 4.  Emphysema (ICD10-J43.9). Electronically Signed   By: Leanna Battles M.D.   On: 06/29/2023 10:15     ASSESSMENT & PLAN:   57 y.o. gentleman with past medical history of nicotine dependence, dyslipidemia, multiple gastric ulcers, H. pylori infection, colon polyps, chronic low back pain, was referred to our clinic for further evaluation of elevated WBC.  Leucocytosis - Fluctuating white blood cell count over the years, with a recent decrease to normal levels. Eosinophils slightly elevated, possibly due to H. pylori infection.  -Clinical picture is not concerning for leukemia based on the long-standing nature of leukocytosis and absence of constitutional symptoms. -Discussed various possible etiologies for leukocytosis including chronic infection, inflammation, reactionary leukocytosis, bone marrow disorders etc.  In his case, history of smoking could be also the driving factor along with previously undetected H. pylori infection. -Labs today revealed normal white count of 9700 with persistent, mild eosinophilia with absolute eosinophil count of 1100. -Given his fluctuating leukocytosis, we will obtain BCR/ABL 1 testing to rule out any underlying MPN.  Will also obtain flow cytometry of peripheral blood for further evaluation, especially given persistent eosinophilia. -Follow-up phone call in 2 weeks to discuss results. -He was provided reassurance.  I will plan to see him again in 3 months for repeat labs.  H. pylori infection - Diagnosed on recent EGD on 06/07/2023.   -Currently undergoing treatment with antibiotics for 14 days. -Continue current treatment regimen.  Solitary pulmonary nodule on lung CT - A 7mm right  upper lobe lung nodule identified on recent low-dose CT scan of the lungs. No prior scans for comparison. Patient recently quit smoking. -Repeat CT scan in 6 months (April 2025) to monitor nodule.  Patient stated that he will follow-up with his PCP regarding this.  Cardiovascular risk Evidence of calcium deposits in coronary arteries on recent CT scan. No reported symptoms of heart disease. Patient recently quit smoking. -Encourage continued smoking cessation.  Follow-up in 3 months with repeat labs.  Since the cause of anemia seems to be obvious from iron deficiency, I am not pursuing extensive workup at this time.  If inadequate response to IV iron is noted, we will pursue workup to rule out other etiologies.  Orders Placed This  Encounter  Procedures   CBC with Differential/Platelet    Standing Status:   Future    Number of Occurrences:   1    Standing Expiration Date:   06/28/2024   Comprehensive metabolic panel    Standing Status:   Future    Number of Occurrences:   1    Standing Expiration Date:   06/28/2024   Lactate dehydrogenase    Standing Status:   Future    Number of Occurrences:   1    Standing Expiration Date:   06/28/2024   Sedimentation rate    Standing Status:   Future    Number of Occurrences:   1    Standing Expiration Date:   06/28/2024   BCR ABL1 FISH ([facility])    Standing Status:   Future    Number of Occurrences:   1    Standing Expiration Date:   06/28/2024   Flow Cytometry, Peripheral Blood (Oncology)    Standing Status:   Future    Number of Occurrences:   1    Standing Expiration Date:   06/28/2024     The total time spent in the appointment was 55 minutes encounter with patients including review of chart and various tests results, discussions about plan of care and coordination of care plan.  I reviewed lab results and outside records for this visit and discussed relevant results with the patient. Diagnosis, plan of care and treatment options were also  discussed in detail with the patient. Opportunity provided to ask questions and answers provided to his apparent satisfaction. Provided instructions to call our clinic with any problems, questions or concerns prior to return visit. I recommended to continue follow-up with PCP and sub-specialists. He verbalized understanding and agreed with the plan. No barriers to learning was detected.   Future Appointments  Date Time Provider Department Center  07/13/2023  3:00 PM Kawhi Diebold, Archie Patten, MD [facility] None  09/17/2023  8:20 AM Lula Olszewski, MD [facility]  09/28/2023 10:45 AM [facility] None  09/28/2023 11:20 AM Apurva Reily, Archie Patten, MD [facility] None     Meryl Crutch, MD [facility] [address] [place] [address] Dept: [phone] Dept Fax: [phone]  06/29/2023 4:04 PM   This document was completed utilizing speech recognition software. Grammatical errors, random word insertions, pronoun errors, and incomplete sentences are an occasional consequence of this system due to software limitations, ambient noise, and hardware issues. Any formal questions or concerns about the content, text or information contained within the body of this dictation should be directly addressed to the provider for clarification.

## 2023-07-01 ENCOUNTER — Encounter: Payer: Self-pay | Admitting: Internal Medicine

## 2023-07-01 ENCOUNTER — Other Ambulatory Visit: Payer: Self-pay | Admitting: Internal Medicine

## 2023-07-01 DIAGNOSIS — I272 Pulmonary hypertension, unspecified: Secondary | ICD-10-CM | POA: Insufficient documentation

## 2023-07-01 DIAGNOSIS — I2510 Atherosclerotic heart disease of native coronary artery without angina pectoris: Secondary | ICD-10-CM | POA: Insufficient documentation

## 2023-07-01 DIAGNOSIS — F17200 Nicotine dependence, unspecified, uncomplicated: Secondary | ICD-10-CM

## 2023-07-01 DIAGNOSIS — R933 Abnormal findings on diagnostic imaging of other parts of digestive tract: Secondary | ICD-10-CM | POA: Insufficient documentation

## 2023-07-01 DIAGNOSIS — Z860100 Personal history of colon polyps, unspecified: Secondary | ICD-10-CM | POA: Insufficient documentation

## 2023-07-01 DIAGNOSIS — J438 Other emphysema: Secondary | ICD-10-CM

## 2023-07-01 DIAGNOSIS — R911 Solitary pulmonary nodule: Secondary | ICD-10-CM

## 2023-07-01 DIAGNOSIS — J439 Emphysema, unspecified: Secondary | ICD-10-CM | POA: Insufficient documentation

## 2023-07-02 NOTE — Telephone Encounter (Signed)
Patient's review of lab results/notes letter confirmed.

## 2023-07-03 LAB — SURGICAL PATHOLOGY

## 2023-07-04 LAB — FLOW CYTOMETRY

## 2023-07-04 LAB — BCR ABL1 FISH (GENPATH)

## 2023-07-13 ENCOUNTER — Encounter: Payer: Self-pay | Admitting: Oncology

## 2023-07-13 ENCOUNTER — Inpatient Hospital Stay (HOSPITAL_BASED_OUTPATIENT_CLINIC_OR_DEPARTMENT_OTHER): Payer: 59 | Admitting: Oncology

## 2023-07-13 DIAGNOSIS — R911 Solitary pulmonary nodule: Secondary | ICD-10-CM | POA: Diagnosis not present

## 2023-07-13 DIAGNOSIS — A048 Other specified bacterial intestinal infections: Secondary | ICD-10-CM

## 2023-07-13 DIAGNOSIS — D72829 Elevated white blood cell count, unspecified: Secondary | ICD-10-CM | POA: Diagnosis not present

## 2023-07-13 DIAGNOSIS — D721 Eosinophilia, unspecified: Secondary | ICD-10-CM | POA: Diagnosis not present

## 2023-07-13 NOTE — Progress Notes (Signed)
[facility]  HEMATOLOGY-ONCOLOGY ELECTRONIC VISIT PROGRESS NOTE  Patient Care Team: Timothy Olszewski, MD as PCP - General (Internal Medicine)  I connected with the patient via telephone conference and verified that I am speaking with the correct person using two identifiers. The patient's location is at home and I am providing care from the [facility].  I discussed the limitations, risks, security and privacy concerns of performing an evaluation and management service by e-visits and the availability of in person appointments.  I also discussed with the patient that there may be a patient responsible charge related to this service. The patient expressed understanding and agreed to proceed.   ASSESSMENT & PLAN:   57 y.o. gentleman with past medical history of nicotine dependence, dyslipidemia, multiple gastric ulcers, H. pylori infection, colon polyps, chronic low back pain, was referred to our clinic in November 2024 for further evaluation of elevated WBC.  Hematological workup negative.  Likely reactionary to smoking and previously untreated H. pylori infection.  Leucocytosis - Fluctuating white blood cell count over the years, with a recent decrease to normal levels. Eosinophils slightly elevated, possibly due to H. pylori infection.   -Clinical picture is not concerning for leukemia based on the long-standing nature of leukocytosis and absence of constitutional symptoms. Discussed various possible etiologies for leukocytosis including chronic infection, inflammation, reactionary leukocytosis, bone marrow disorders etc.   -On his initial consultation with [place] on 06/29/2023, labs showed normal white count of 9700 with persistent, mild eosinophilia with absolute eosinophil count of 1100.  Flow cytometry of peripheral blood was unremarkable.  FISH testing for BCR/ABL 1 was negative.  LDH, ESR were within normal limits.  - Most likely etiology for intermittent leukocytosis in  his case was previous cigarette smoking.  It could also be driven by previously undetected H. pylori infection, which is now treated appropriately.  - Clinical picture not consistent with myeloproliferative neoplasm.  Hence we will continue monitoring alone at this time. I will plan to see him again in 3 months for repeat labs.  If stable blood counts, he can be discharged from our office after next visit.  H. pylori infection - Diagnosed on recent EGD on 06/07/2023.   -Completed treatment with antibiotics for 14 days. -Continue Protonix  Solitary pulmonary nodule on lung CT - A 7mm right upper lobe lung nodule identified on recent low-dose CT scan of the lungs. No prior scans for comparison. Patient recently quit smoking. Continue to abstain from smoking and all nicotine products. -Repeat CT scan in 6 months (April 2025) to monitor nodule.  Patient stated that he will follow-up with his PCP regarding this.   Orders Placed This Encounter  Procedures   CBC with Differential/Platelet    Standing Status:   Future    Standing Expiration Date:   07/12/2024    INTERVAL HISTORY:  Please see above for problem oriented charting.  The purpose of today's discussion is to explain recent lab results and to formulate plan of care.  He reports no major health issues since his last visit a couple of weeks ago. He denies fevers, chills, and night sweats. He has completed his course of antibiotics for H Pylori infection and is currently on Protonix. He denies any symptoms of acid reflux or heartburn. He was referred for evaluation of his fluctuating white count, which has since normalized. He also reports quitting smoking recently.  HEMATOLOGY HISTORY:  57 y.o. gentleman with past medical history of nicotine dependence, dyslipidemia, multiple  gastric ulcers, H. pylori infection, colon polyps, chronic low back pain, was referred to our clinic in November 2024 for further evaluation of elevated WBC.    On routine labs with his PCP on 05/26/2023, white count was found to be elevated at 13,900 with ANC of 6100, eosinophil count is slightly elevated at 800.  Hemoglobin was 16.4, platelet count 219,000.  Previously patient's white count was slightly elevated at 10,700 in November 2015, 15,000-17,000 from 2009 until 2012.  Given fluctuating white count, a referral was sent to [place] for further evaluation.   He had EGD and colonoscopy on 06/07/2023.  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  Negative for dysplasia.  Positive for H. pylori.  Descending colon polyp was removed and it showed polypoid colonic mucosa and negative for dysplasia.  Sigmoid colon polyp was found to be hyperplastic type.   On 06/01/2023, peripheral smear reviewed by pathologist showed eosinophilia.  No other major abnormalities.  White count was 10,200 that day, with otherwise normal differential.  ESR was 23 mm/h, slightly elevated.  CRP was undetectable.  B12, folate were within normal limits.  HIV, hepatitis C testing negative.  Hepatitis B testing showed evidence of immunity from vaccination.  ANA negative.  Rheumatoid factor was increased at 48 international units/mL.  However anti-CCP antibody was negative.  Celiac disease testing was negative.  CEA, TSH, uric acid within normal limits.   He completed antibiotic course for H. Pylori.    In addition to these issues, the patient has a small lung nodule on the right upper side, discovered during a CT scan in October 2024. The nodule measures about 7 millimeters, and a follow-up scan is recommended in six months. The patient also has calcium deposits in the blood vessels supplying the heart, but reports no known heart problems or symptoms.   The patient quit smoking cigars in October 2024, after a history of smoking about a pack a day. He also has a family history of cancer, with multiple relatives on his mother's side having had some  form of the disease. His mother had multiple myeloma and passed away at the age of 54.  On his initial consultation with [place] on 06/29/2023, labs showed normal white count of 9700 with persistent, mild eosinophilia with absolute eosinophil count of 1100.  Flow cytometry of peripheral blood was unremarkable.  FISH testing for BCR/ABL 1 was negative.  LDH, ESR were within normal limits.  Most likely etiology for intermittent leukocytosis in his case was previous cigarette smoking.  It could also be driven by previously undetected H. pylori infection, which is now treated appropriately.  Clinical picture not consistent with myeloproliferative neoplasm.  Hence we will continue monitoring alone at this time.  REVIEW OF SYSTEMS:    Review of Systems - Oncology  All other pertinent systems were reviewed with the patient and are negative.  I have reviewed the past medical history, past surgical history, social history and family history with the patient and they are unchanged from previous note.  ALLERGIES:  He has No Known Allergies.  MEDICATIONS:  Current Outpatient Medications  Medication Sig Dispense Refill   amoxicillin (AMOXIL) 500 MG capsule 2 capsules Orally every 12 hrs for 14 days     clarithromycin (BIAXIN) 500 MG tablet 1 tablet Orally every 12 hrs for 14 days     cyclobenzaprine (FLEXERIL) 10 MG tablet Take 1 tablet (10 mg total) by mouth 3 (three) times daily as needed for muscle  spasms. (Patient not taking: Reported on 06/29/2023) 30 tablet 0   dicyclomine (BENTYL) 20 MG tablet Take 1 tablet (20 mg total) by mouth 2 (two) times daily. (Patient not taking: Reported on 06/29/2023) 20 tablet 0   HYDROcodone-acetaminophen (NORCO/VICODIN) 5-325 MG tablet Take 1 tablet by mouth every 6 (six) hours as needed. (Patient not taking: Reported on 06/29/2023)     ibuprofen (ADVIL) 800 MG tablet Take 800 mg by mouth every 6 (six) hours as needed. (Patient not taking: Reported on 06/29/2023)     nicotine  (NICODERM CQ - DOSED IN MG/24 HOURS) 14 mg/24hr patch RX #1 Weeks 1-6: 14 mg x 1 patch daily. Wear for 24 hours. If you have sleep disturbances, remove at bedtime. 42 patch 0   nicotine (NICODERM CQ - DOSED IN MG/24 HR) 7 mg/24hr patch RX #2 Weeks 7-8: 7 mg x 1 patch dailyWear for 24 hours. If you have sleep disturbances, remove at bedtime.. 14 patch 0   nicotine polacrilex (COMMIT) 2 MG lozenge RX #1 Weeks 1-6: 1 lozenge every 1-2 hours. Use at least 9 lozenges per day for the first 6 weeks. Max 20 lozenges per day. 1008 lozenge 0   nicotine polacrilex (COMMIT) 2 MG lozenge RX #2 Weeks 7-9: 1 lozenge every 2-4 hours.Max 20 lozenges per day. 252 lozenge 0   nicotine polacrilex (COMMIT) 2 MG lozenge RX #3 Weeks 10-12: 1 lozenge every 4-8 hours.Max 20 lozenges per day. 126 lozenge 0   nicotine polacrilex (NICORETTE) 2 MG gum RX #1 Weeks 1-6: 1 piece every 1-2 hours.Use at least 9 pieces of gum per day for the first 6 weeks. Max 24 pieces per day. 1008 each 0   nicotine polacrilex (NICORETTE) 2 MG gum RX #2 Weeks 7-9: 1 piece every 2-4 hours.Max 24 pieces per day. 252 each 0   nicotine polacrilex (NICORETTE) 2 MG gum RX #3 Weeks 10-12: 1 piece every 4-8 hours.Max 24 pieces per day. 126 each 0   ondansetron (ZOFRAN-ODT) 4 MG disintegrating tablet Take 1 tablet (4 mg total) by mouth every 8 (eight) hours as needed. (Patient not taking: Reported on 06/29/2023) 20 tablet 0   pantoprazole (PROTONIX) 40 MG tablet Take 40 mg by mouth daily. reported     polyethylene glycol (MIRALAX / GLYCOLAX) 17 g packet Take 17 g by mouth daily. Reported     No current facility-administered medications for this visit.    PHYSICAL EXAMINATION: ECOG PERFORMANCE STATUS: 1 - Symptomatic but completely ambulatory  LABORATORY DATA:   I have reviewed the data as listed.   Recent Results (from the past 2160 hour(s))  Lipase, blood     Status: Abnormal   Collection Time: 05/26/23  1:43 PM  Result Value Ref Range   Lipase 10  (L) 11 - 51 U/L    Comment: Performed at [facility], [address] [place], [place] [address]  Comprehensive metabolic panel     Status: Abnormal   Collection Time: 05/26/23  1:43 PM  Result Value Ref Range   Sodium 136 135 - 145 mmol/L   Potassium 3.6 3.5 - 5.1 mmol/L   Chloride 100 98 - 111 mmol/L   CO2 24 22 - 32 mmol/L   Glucose, Bld 111 (H) 70 - 99 mg/dL    Comment: Glucose reference range applies only to samples taken after fasting for at least 8 hours.   BUN 11 6 - 20 mg/dL   Creatinine, Ser 6.27 0.61 - 1.24 mg/dL   Calcium 9.7  8.9 - 10.3 mg/dL   Total Protein 8.0 6.5 - 8.1 g/dL   Albumin 4.3 3.5 - 5.0 g/dL   AST 15 15 - 41 U/L   ALT 8 0 - 44 U/L   Alkaline Phosphatase 85 38 - 126 U/L   Total Bilirubin 0.7 0.3 - 1.2 mg/dL   GFR, Estimated >09 >81 mL/min    Comment: (NOTE) Calculated using the CKD-EPI Creatinine Equation (2021)    Anion gap 12 5 - 15    Comment: Performed at [facility], [address] [place], [place] [address]  CBC     Status: Abnormal   Collection Time: 05/26/23  1:43 PM  Result Value Ref Range   WBC 13.9 (H) 4.0 - 10.5 K/uL   RBC 5.39 4.22 - 5.81 MIL/uL   Hemoglobin 16.4 13.0 - 17.0 g/dL   HCT 82.9 56.2 - 13.0 %   MCV 88.5 80.0 - 100.0 fL   MCH 30.4 26.0 - 34.0 pg   MCHC 34.4 30.0 - 36.0 g/dL   RDW 86.5 78.4 - 69.6 %   Platelets 219 150 - 400 K/uL   nRBC 0.0 0.0 - 0.2 %    Comment: Performed at [facility], [address] [place], [place] [address]  Urinalysis, Routine w reflex microscopic -Urine, Clean Catch     Status: Abnormal   Collection Time: 05/26/23  1:43 PM  Result Value Ref Range   Color, Urine YELLOW YELLOW   APPearance CLEAR CLEAR   Specific Gravity, Urine 1.035 (H) 1.005 - 1.030   pH 6.0 5.0 - 8.0   Glucose, UA NEGATIVE NEGATIVE mg/dL   Hgb urine dipstick MODERATE (A) NEGATIVE   Bilirubin Urine NEGATIVE NEGATIVE   Ketones, ur 15 (A) NEGATIVE mg/dL   Protein,  ur 30 (A) NEGATIVE mg/dL   Nitrite NEGATIVE NEGATIVE   Leukocytes,Ua NEGATIVE NEGATIVE   RBC / HPF 11-20 0 - 5 RBC/hpf   WBC, UA 0-5 0 - 5 WBC/hpf   Bacteria, UA NONE SEEN NONE SEEN   Squamous Epithelial / HPF 0-5 0 - 5 /HPF   Mucus PRESENT     Comment: Performed at [facility], [address] [place], [place] [address]  Urine cytology ancillary only     Status: None   Collection Time: 06/01/23 10:02 AM  Result Value Ref Range   Neisseria Gonorrhea Negative    Chlamydia Negative    Trichomonas Negative    Comment Normal Reference Range Trichomonas - Negative    Comment Normal Reference Ranger Chlamydia - Negative    Comment      Normal Reference Range Neisseria Gonorrhea - Negative  Urinalysis, Routine w reflex microscopic     Status: Abnormal   Collection Time: 06/01/23 10:06 AM  Result Value Ref Range   Color, Urine YELLOW Yellow;Lt. Yellow;Straw;Dark Yellow;Amber;Green;Red;Brown   APPearance CLEAR Clear;Turbid;Slightly Cloudy;Cloudy   Specific Gravity, Urine >=1.030 (A) 1.000 - 1.030   pH 6.0 5.0 - 8.0   Total Protein, Urine 30 (A) Negative   Urine Glucose NEGATIVE Negative   Ketones, ur NEGATIVE Negative   Bilirubin Urine NEGATIVE Negative   Hgb urine dipstick TRACE-INTACT (A) Negative   Urobilinogen, UA 0.2 0.0 - 1.0   Leukocytes,Ua NEGATIVE Negative   Nitrite NEGATIVE Negative   WBC, UA 0-2/hpf 0-2/hpf   RBC / HPF 0-2/hpf 0-2/hpf   Mucus, UA Presence of (A) None   Squamous Epithelial / HPF Rare(0-4/hpf) Rare(0-4/hpf)  Pathologist smear review     Status: None  Collection Time: 06/01/23 10:06 AM  Result Value Ref Range   Path Review      Comment: Absolute eosinophilia, the most common associations being allergic disorders, certain parasitic infections and Hodgkin's Disease. Myeloid population consists predominantly of mature segmented neutrophils. Review of the peripheral smear reveals adequate numbers of platelets. A few Elliptocytes and  Target Cells noted. Reviewed by Nehemiah Massed Clementeen Graham, MD  (Electronic Signature on File)      06/04/2023.   CBC with Differential/Platelet     Status: Abnormal   Collection Time: 06/01/23 10:06 AM  Result Value Ref Range   WBC 10.2 4.0 - 10.5 K/uL   RBC 4.87 4.22 - 5.81 Mil/uL   Hemoglobin 14.8 13.0 - 17.0 g/dL   HCT 29.5 62.1 - 30.8 %   MCV 92.6 78.0 - 100.0 fl   MCHC 32.8 30.0 - 36.0 g/dL   RDW 65.7 84.6 - 96.2 %   Platelets 203.0 150.0 - 400.0 K/uL   Neutrophils Relative % 59.5 43.0 - 77.0 %   Lymphocytes Relative 23.1 12.0 - 46.0 %   Monocytes Relative 8.4 3.0 - 12.0 %   Eosinophils Relative 7.7 (H) 0.0 - 5.0 %   Basophils Relative 1.3 0.0 - 3.0 %   Neutro Abs 6.1 1.4 - 7.7 K/uL   Lymphs Abs 2.4 0.7 - 4.0 K/uL   Monocytes Absolute 0.9 0.1 - 1.0 K/uL   Eosinophils Absolute 0.8 (H) 0.0 - 0.7 K/uL   Basophils Absolute 0.1 0.0 - 0.1 K/uL  Comp Met (CMET)     Status: None   Collection Time: 06/01/23 10:06 AM  Result Value Ref Range   Sodium 137 135 - 145 mEq/L   Potassium 4.7 3.5 - 5.1 mEq/L   Chloride 103 96 - 112 mEq/L   CO2 29 19 - 32 mEq/L   Glucose, Bld 91 70 - 99 mg/dL   BUN 10 6 - 23 mg/dL   Creatinine, Ser 9.52 0.40 - 1.50 mg/dL   Total Bilirubin 0.7 0.2 - 1.2 mg/dL   Alkaline Phosphatase 78 39 - 117 U/L   AST 23 0 - 37 U/L   ALT 14 0 - 53 U/L   Total Protein 6.6 6.0 - 8.3 g/dL   Albumin 4.0 3.5 - 5.2 g/dL   GFR 84.13 >24.40 mL/min    Comment: Calculated using the CKD-EPI Creatinine Equation (2021)   Calcium 9.3 8.4 - 10.5 mg/dL  Urine Culture     Status: None   Collection Time: 06/01/23 10:06 AM   Specimen: Blood  Result Value Ref Range   MICRO NUMBER: [id number]    SPECIMEN QUALITY: Adequate    Sample Source NOT GIVEN    STATUS: FINAL    Result: No Growth   Sedimentation rate     Status: Abnormal   Collection Time: 06/01/23 10:06 AM  Result Value Ref Range   Sed Rate 23 (H) 0 - 20 mm/hr  C-reactive protein     Status: None   Collection Time:  06/01/23 10:06 AM  Result Value Ref Range   CRP <1.0 0.5 - 20.0 mg/dL  PSA     Status: None   Collection Time: 06/01/23 10:06 AM  Result Value Ref Range   PSA 0.80 0.10 - 4.00 ng/mL    Comment: Test performed using Access Hybritech PSA Assay, a parmagnetic partical, chemiluminecent immunoassay.  HIV antibody (with reflex)     Status: None   Collection Time: 06/01/23 10:06 AM  Result Value Ref Range   HIV  1&2 Ab, 4th Generation NON-REACTIVE NON-REACTIVE    Comment: HIV-1 antigen and HIV-1/HIV-2 antibodies were not detected. There is no laboratory evidence of HIV infection. Marland Kitchen PLEASE NOTE: This information has been disclosed to you from records whose confidentiality may be protected by state law.  If your state requires such protection, then the state law prohibits you from making any further disclosure of the information without the specific written consent of the person to whom it pertains, or as otherwise permitted by law. A general authorization for the release of medical or other information is NOT sufficient for this purpose. . For additional information please refer to http://education.[facility].com/faq/FAQ106 (This link is being provided for informational/ educational purposes only.) . Marland Kitchen The performance of this assay has not been clinically validated in patients less than 62 years old. .   B12 and Folate Panel     Status: None   Collection Time: 06/01/23 10:06 AM  Result Value Ref Range   Vitamin B-12 492 211 - 911 pg/mL   Folate 7.3 >5.9 ng/mL  Hepatitis B Surface AntiGEN     Status: None   Collection Time: 06/01/23 10:06 AM  Result Value Ref Range   Hepatitis B Surface Ag NON-REACTIVE NON-REACTIVE    Comment: . For additional information, please refer to  http://education.[facility].com/faq/FAQ202  (This link is being provided for informational/ educational purposes only.) .   Hepatitis B surface antibody,qualitative     Status: Abnormal    Collection Time: 06/01/23 10:06 AM  Result Value Ref Range   Hep B S Ab REACTIVE (A) NON-REACTIVE  Lipid panel     Status: Abnormal   Collection Time: 06/01/23 10:06 AM  Result Value Ref Range   Cholesterol 156 0 - 200 mg/dL    Comment: ATP III Classification       Desirable:  < 200 mg/dL               Borderline High:  200 - 239 mg/dL          High:  > = 161 mg/dL   Triglycerides 09.6 0.0 - 149.0 mg/dL    Comment: Normal:  <045 mg/dLBorderline High:  150 - 199 mg/dL   HDL 40.98 (L) >11.91 mg/dL   VLDL 47.8 0.0 - 29.5 mg/dL   LDL Cholesterol 621 (H) 0 - 99 mg/dL   Total CHOL/HDL Ratio 4     Comment:                Men          Women1/2 Average Risk     3.4          3.3Average Risk          5.0          4.42X Average Risk          9.6          7.13X Average Risk          15.0          11.0                       NonHDL 118.68     Comment: NOTE:  Non-HDL goal should be 30 mg/dL higher than patient's LDL goal (i.e. LDL goal of < 70 mg/dL, would have non-HDL goal of < 100 mg/dL)  IgG, IgA, IgM     Status: Abnormal   Collection Time: 06/01/23 10:06 AM  Result Value Ref Range   Immunoglobulin  A 511 (H) 47 - 310 mg/dL   IgG (Immunoglobin G), Serum 1,586 600 - 1,640 mg/dL   IgM, Serum 469 50 - 629 mg/dL  ANA w/Reflex if Positive     Status: None   Collection Time: 06/01/23 10:06 AM  Result Value Ref Range   Anti Nuclear Antibody (ANA) Negative Negative  Rheumatoid factor     Status: Abnormal   Collection Time: 06/01/23 10:06 AM  Result Value Ref Range   Rheumatoid fact SerPl-aCnc 48 (H) <14 IU/mL  Cyclic citrul peptide antibody, IgG ([facility])     Status: None   Collection Time: 06/01/23 10:06 AM  Result Value Ref Range   Cyclic Citrullin Peptide Ab <52 UNITS    Comment: Reference Range Negative:            <20 Weak Positive:       20-39 Moderate Positive:   40-59 Strong Positive:     >59 .   Celiac Disease Comprehensive Panel with Reflexes     Status: Abnormal   Collection Time:  06/01/23 10:06 AM  Result Value Ref Range   INTERPRETATION      Comment: . No serological evidence of celiac disease. . Total serum IgA is elevated. Consider mucosal  inflammatory conditions or underlying gammopathy. .    (tTG) Ab, IgA <1.0 U/mL    Comment: Value          Interpretation -----          -------------- <15.0          Antibody not detected > or = 15.0    Antibody detected .    Immunoglobulin A 493 (H) 47 - 310 mg/dL  Tryptase     Status: None   Collection Time: 06/01/23 10:06 AM  Result Value Ref Range   Tryptase 9.6 <11.0 mcg/L    Comment: The Tryptase test, fluorescent enzyme immunoassay (FEIA), measures both the Alpha and Beta forms of Tryptase. Measuring both forms of Tryptase increases sensitivity for the diagnosis of mastocytosis, and mast cell degranulation as a cause of anaphylaxis.   Uric acid     Status: None   Collection Time: 06/01/23 10:06 AM  Result Value Ref Range   Uric Acid, Serum 4.1 4.0 - 7.8 mg/dL  CEA     Status: None   Collection Time: 06/01/23 10:06 AM  Result Value Ref Range   CEA <2.0 ng/mL    Comment: Non-Smoker: <2.5 Smoker:     <5.0 . . This test was performed using the Siemens  chemiluminescent method. Values obtained from different assay methods cannot be used interchangeably. CEA levels, regardless of value, should not be interpreted as absolute evidence of the presence or absence of disease. .   TSH Rfx on Abnormal to Free T4     Status: None   Collection Time: 06/01/23 10:07 AM  Result Value Ref Range   TSH CANCELED uIU/mL    Comment: Test not performed. Deterioration occurred during specimen handling.  CONTACTED TIFFANY L. AT YOUR FACILITY  Result canceled by the ancillary.   HM COLONOSCOPY     Status: None   Collection Time: 06/07/23 10:41 AM  Result Value Ref Range   HM Colonoscopy See Report (in chart) See Report (in chart), Patient Reported    Comment: Abstracted by HIM  TSH Rfx on Abnormal to Free T4      Status: None   Collection Time: 06/13/23  9:23 AM  Result Value Ref Range   TSH 0.844 0.450 -  4.500 uIU/mL  Hepatitis C Antibody     Status: None   Collection Time: 06/13/23  9:23 AM  Result Value Ref Range   Hepatitis C Ab NON-REACTIVE NON-REACTIVE    Comment: . HCV antibody was non-reactive. There is no laboratory  evidence of HCV infection. . In most cases, no further action is required. However, if recent HCV exposure is suspected, a test for HCV RNA (test code 62376) is suggested. . For additional information please refer to http://education.[facility].com/faq/FAQ22v1 (This link is being provided for informational/ educational purposes only.) .   CBC with Differential/Platelet     Status: Abnormal   Collection Time: 06/15/23 10:04 AM  Result Value Ref Range   WBC 9.8 4.0 - 10.5 K/uL   RBC 4.64 4.22 - 5.81 Mil/uL   Hemoglobin 14.0 13.0 - 17.0 g/dL   HCT 28.3 15.1 - 76.1 %   MCV 93.5 78.0 - 100.0 fl   MCHC 32.2 30.0 - 36.0 g/dL   RDW 60.7 37.1 - 06.2 %   Platelets 194.0 150.0 - 400.0 K/uL   Neutrophils Relative % 58.6 43.0 - 77.0 %   Lymphocytes Relative 24.8 12.0 - 46.0 %   Monocytes Relative 7.6 3.0 - 12.0 %   Eosinophils Relative 7.8 (H) 0.0 - 5.0 %   Basophils Relative 1.2 0.0 - 3.0 %   Neutro Abs 5.7 1.4 - 7.7 K/uL   Lymphs Abs 2.4 0.7 - 4.0 K/uL   Monocytes Absolute 0.7 0.1 - 1.0 K/uL   Eosinophils Absolute 0.8 (H) 0.0 - 0.7 K/uL   Basophils Absolute 0.1 0.0 - 0.1 K/uL  C-reactive protein     Status: None   Collection Time: 06/15/23 10:04 AM  Result Value Ref Range   CRP <1.0 0.5 - 20.0 mg/dL  Surgical pathology     Status: None   Collection Time: 06/29/23 12:00 AM  Result Value Ref Range   SURGICAL PATHOLOGY      Surgical Pathology CASE: [id number] PATIENT: Sahand Scheurich Flow Pathology Report     Clinical history: Leukocytosis, unspecified type     DIAGNOSIS:  - No abnormal B or T-cell population identified  GATING AND PHENOTYPIC  ANALYSIS:  Gated population: Flow cytometric immunophenotyping is performed using antibodies to the antigens listed in the table below. Electronic gates are placed around a cell cluster displaying light scatter properties corresponding to: lymphocytes  Abnormal Cells in gated population: N/A  Phenotype of Abnormal Cells: N/A                      Lymphoid Antigens       Myeloid Antigens Miscellaneous CD2  tested    CD10 tested    CD11b     ND   CD45 tested CD3  tested    CD19 tested    CD11c     ND   HLA-Dr    ND CD4  tested    CD20 tested    CD13 ND   CD34 tested CD5  tested    CD22 ND   CD14 ND   CD38 tested CD7  tested    CD79b     ND   CD15 ND   CD138     ND CD8  tested    CD103     ND   CD16 ND   TdT  ND CD25 ND   CD200     t ested    CD33 ND   CD123     ND TCRab  ND   sKappa    tested    CD64 ND   CD41 ND TCRgd     tested    sLambda   tested    CD117     ND   CD61 ND CD56 tested    cKappa    ND   MPO  ND   CD71 ND CD57 ND   cLambda   ND        [id number]      GROSS DESCRIPTION:  One lavender top tube submitted from [facility] for lymphoma testing.    Final Diagnosis performed by Clifton James, MD.   Electronically signed 07/03/2023 Technical and / or Professional components performed at [facility], [address] [place], [place] [address].  The above tests were developed and their performance characteristics determined by the [facility] system for the physical and immunophenotypic characterization of cell populations. They have not been cleared by the U.S. Food and Drug administration. The  FDA has determined that such clearance or approval is not necessary. This test is used for clinical purposes. It should not be  regarded as investigational or for research   Flow Cytometry, Peripheral Blood (Oncology)     Status: None   Collection Time: 06/29/23  2:44 PM  Result Value Ref Range   Flow Cytometry SEE SEPARATE REPORT     Comment: Performed at  [facility], [address], [place], [place] [address]  BCR ABL1 FISH Eulogio Ditch)     Status: None   Collection Time: 06/29/23  2:44 PM  Result Value Ref Range   BCR ABL1 / ABL1 SEE SEPARATE REPORT     Comment: Performed at [facility], [address], [place], [place] [address]  Sedimentation rate     Status: None   Collection Time: 06/29/23  2:44 PM  Result Value Ref Range   Sed Rate 11 0 - 16 mm/hr    Comment: Performed at [facility], [address], [place], [place] [address]  Comprehensive metabolic panel     Status: Abnormal   Collection Time: 06/29/23  2:44 PM  Result Value Ref Range   Sodium 138 135 - 145 mmol/L   Potassium 4.1 3.5 - 5.1 mmol/L   Chloride 107 98 - 111 mmol/L   CO2 27 22 - 32 mmol/L   Glucose, Bld 87 70 - 99 mg/dL    Comment: Glucose reference range applies only to samples taken after fasting for at least 8 hours.   BUN 13 6 - 20 mg/dL   Creatinine, Ser 2.13 0.61 - 1.24 mg/dL   Calcium 9.3 8.9 - 08.6 mg/dL   Total Protein 7.4 6.5 - 8.1 g/dL   Albumin 3.9 3.5 - 5.0 g/dL   AST 15 15 - 41 U/L   ALT 9 0 - 44 U/L   Alkaline Phosphatase 88 38 - 126 U/L   Total Bilirubin 0.4 <1.2 mg/dL   GFR, Estimated >57 >84 mL/min    Comment: (NOTE) Calculated using the CKD-EPI Creatinine Equation (2021)    Anion gap 4 (L) 5 - 15    Comment: Performed at [facility], [address], [place], [place] [address]  CBC with Differential/Platelet     Status: Abnormal   Collection Time: 06/29/23  2:44 PM  Result Value Ref Range   WBC 9.7 4.0 - 10.5 K/uL   RBC 4.75 4.22 - 5.81 MIL/uL   Hemoglobin 14.7 13.0 -  17.0 g/dL   HCT 42.5 95.6 - 38.7 %   MCV 91.6 80.0 - 100.0 fL   MCH 30.9 26.0 - 34.0 pg   MCHC 33.8 30.0 - 36.0 g/dL   RDW 56.4 33.2 - 95.1 %   Platelets 195 150 - 400 K/uL   nRBC 0.0 0.0 - 0.2 %   Neutrophils Relative % 51 %   Neutro Abs 4.9 1.7 - 7.7 K/uL   Lymphocytes  Relative 29 %   Lymphs Abs 2.8 0.7 - 4.0 K/uL   Monocytes Relative 8 %   Monocytes Absolute 0.8 0.1 - 1.0 K/uL   Eosinophils Relative 11 %   Eosinophils Absolute 1.1 (H) 0.0 - 0.5 K/uL   Basophils Relative 1 %   Basophils Absolute 0.1 0.0 - 0.1 K/uL   Immature Granulocytes 0 %   Abs Immature Granulocytes 0.03 0.00 - 0.07 K/uL    Comment: Performed at [facility], [address], [place], [place] [address]  Lactate dehydrogenase     Status: None   Collection Time: 06/29/23  2:45 PM  Result Value Ref Range   LDH 132 98 - 192 U/L    Comment: Performed at [facility], [address], [place], [place] [address]      RADIOGRAPHIC STUDIES:  I have personally reviewed the radiological images as listed and agree with the findings in the report.  Low Dose CT Chest w/o Contrast for Lung Cancer Screening [[id number]]  Result Date: 06/29/2023 CLINICAL DATA:  Former smoker, quit 2 weeks ago, 28 pack-year history. EXAM: CT CHEST WITHOUT CONTRAST LOW-DOSE FOR LUNG CANCER SCREENING TECHNIQUE: Multidetector CT imaging of the chest was performed following the standard protocol without IV contrast. RADIATION DOSE REDUCTION: This exam was performed according to the departmental dose-optimization program which includes automated exposure control, adjustment of the mA and/or kV according to patient size and/or use of iterative reconstruction technique. COMPARISON:  None Available. FINDINGS: Cardiovascular: Age advanced left anterior descending coronary artery calcification. Heart size normal. No pericardial effusion. Enlarged pulmonic trunk. Mediastinum/Nodes: No pathologically enlarged mediastinal or axillary lymph nodes. Hilar regions are difficult to definitively evaluate without IV contrast. Esophagus is grossly unremarkable. Lungs/Pleura: Centrilobular and paraseptal emphysema. Smoking related respiratory bronchiolitis. 7.1 mm nodule in the medial aspect of  the anterior segment right upper lobe (3/149). Pulmonary nodules otherwise measure 5.6 mm or less in size. No suspicious pulmonary nodules. No pleural fluid. Airway is unremarkable. Upper Abdomen: Probable small cyst in the posterior right hepatic lobe. No specific follow-up necessary. Visualized portions of the liver, gallbladder, adrenal glands, kidneys, spleen, pancreas, stomach and bowel are otherwise grossly unremarkable. No upper abdominal adenopathy. Musculoskeletal: Degenerative changes in the spine. IMPRESSION: 1. 7.1 mm medial right upper lobe nodule. Lung-RADS 3, probably benign findings. Short-term follow-up in 6 months is recommended with repeat low-dose chest CT without contrast (please use the following order, "CT CHEST LCS NODULE FOLLOW-UP W/O CM"). These results will be called to the ordering clinician or representative by the Radiologist Assistant, and communication documented in the PACS or [employer]. 2. Age advanced left anterior descending coronary artery calcification. 3. Enlarged pulmonic trunk, indicative of pulmonary arterial hypertension. 4.  Emphysema (ICD10-J43.9). Electronically Signed   By: Leanna Battles M.D.   On: 06/29/2023 10:15    I discussed the assessment and treatment plan with the patient. The patient was provided an opportunity to ask questions and all were answered. The patient agreed with the plan and demonstrated an  understanding of the instructions. The patient was advised to call back or seek an in-person evaluation if the symptoms worsen or if the condition fails to improve as anticipated.    I spent 20 minutes for the appointment reviewing test results, discuss management and coordination of care.  Meryl Crutch, MD 07/13/2023 2:28 PM [facility] [address] [place] [address] Dept: [phone] Dept Fax: [phone]   Future Appointments  Date Time Provider Department Center   07/13/2023  3:00 PM Umaima Scholten, Archie Patten, MD [facility] None  07/25/2023 10:05 AM Swinyer, Zachary George, NP [facility]  09/17/2023  8:20 AM Timothy Olszewski, MD [facility]  09/28/2023 10:45 AM [facility] None  09/28/2023 11:20 AM Laquanna Veazey, Archie Patten, MD [facility] None    This document was completed utilizing speech recognition software. Grammatical errors, random word insertions, pronoun errors, and incomplete sentences are an occasional consequence of this system due to software limitations, ambient noise, and hardware issues. Any formal questions or concerns about the content, text or information contained within the body of this dictation should be directly addressed to the provider for clarification.

## 2023-07-13 NOTE — Assessment & Plan Note (Addendum)
-   Fluctuating white blood cell count over the years, with a recent decrease to normal levels. Eosinophils slightly elevated, possibly due to H. pylori infection.   -Clinical picture is not concerning for leukemia based on the long-standing nature of leukocytosis and absence of constitutional symptoms. Discussed various possible etiologies for leukocytosis including chronic infection, inflammation, reactionary leukocytosis, bone marrow disorders etc.   -On his initial consultation with [place] on 06/29/2023, labs showed normal white count of 9700 with persistent, mild eosinophilia with absolute eosinophil count of 1100.  Flow cytometry of peripheral blood was unremarkable.  FISH testing for BCR/ABL 1 was negative.  LDH, ESR were within normal limits.  - Most likely etiology for intermittent leukocytosis in his case was previous cigarette smoking.  It could also be driven by previously undetected H. pylori infection, which is now treated appropriately.  - Clinical picture not consistent with myeloproliferative neoplasm.  Hence we will continue monitoring alone at this time. I will plan to see him again in 3 months for repeat labs.  If stable blood counts, he can be discharged from our office after next visit.

## 2023-07-13 NOTE — Assessment & Plan Note (Addendum)
-   A 7mm right upper lobe lung nodule identified on recent low-dose CT scan of the lungs. No prior scans for comparison. Patient recently quit smoking. Continue to abstain from smoking and all nicotine products. -Repeat CT scan in 6 months (April 2025) to monitor nodule.  Patient stated that he will follow-up with his PCP regarding this.

## 2023-07-13 NOTE — Assessment & Plan Note (Addendum)
-   Diagnosed on recent EGD on 06/07/2023.   -Completed treatment with antibiotics for 14 days. -Continue Protonix

## 2023-07-24 NOTE — Progress Notes (Unsigned)
Cardiology Office Note:  .   Date:  07/25/2023 ID:  Timothy Gladden Dawkins Sr., DOB November 07, 1965, MRN [id number] PCP: Lula Olszewski, MD [facility] Providers Cardiologist:  None   Patient Profile: .      PMH Coronary calcification noted on lung CT 06/18/23 Emphysema Former tobacco abuse Hyperlipidemia       History of Present Illness: .   Timothy Gladden Narducci Sr. is a very pleasant 57 y.o. male  who is here today for new patient consult for coronary calcification on lung cancer screening CT. The calcification was described as 'age advanced' and was particularly heavy in the left anterior descending coronary artery. Since he got the report, he has incorporated significant lifestyle changes including quitting smoking and starting a regular exercise regimen. He is also starting to incorporate healthier diet options to avoid fried food and eat more fruits and vegetables. He reports shortness of breath, particularly in the mornings and when initially starting his exercise routine. He feels some improvement when exercising but continues to have shortness of breath in the mornings. He has not experienced any chest discomfort, but does note a sensation of his heart 'pounding' or 'beating funny' when he first started exercising or when moving at a certain pace. Is sleeping at an incline due to shortness of breath when lying flat, and reports waking up around 3:30am every morning with difficulty breathing. Also occasional left arm pain that has improved with wearing a brace. No snoring that has been reported. He denies swelling in his legs, arms, or abdomen. 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 Requires sitting for about 6 hours daily.     Family history: His family history includes COPD in his sister; Cancer in his maternal grandfather, maternal grandmother, and mother; Depression in his sister; Diabetes in his sister; Early death in his sister.  COPD in sister  ASCVD Risk Score: The  10-year ASCVD risk score (Arnett DK, et al., 2019) is: 6.3%   Values used to calculate the score:     Age: 89 years     Sex: Male     Is Non-Hispanic African American: Yes     Diabetic: No     Tobacco smoker: No     Systolic Blood Pressure: 120 mmHg     Is BP treated: No     HDL Cholesterol: 37.2 mg/dL     Total Cholesterol: 156 mg/dL    Diet: Has improved over the last month, more fruit and baked food Eating out frequently - cafeteria food, often fried food and vegetables  Activity: Has started walking for exercise after work Taking care of his father   ROS: See HPI       Studies Reviewed: Marland Kitchen   EKG Interpretation Date/Time:  Wednesday July 25 2023 10:02:11 EST Ventricular Rate:  58 PR Interval:  186 QRS Duration:  88 QT Interval:  422 QTC Calculation: 414 R Axis:   -52  Text Interpretation: Sinus bradycardia Left anterior fascicular block No previous ECGs available Confirmed by Eligha Bridegroom [phone]) on 07/25/2023 10:11:05 AM      Risk Assessment/Calculations:             Physical Exam:   VS: BP 120/80 (BP Location: Left Arm, Patient Position: Sitting, Cuff Size: Normal)   Pulse (!) 57   Ht 6' 2.5" (1.892 m)   Wt 195 lb (88.5 kg)   BMI 24.70 kg/m   Wt Readings from Last 3 Encounters:  07/25/23  195 lb (88.5 kg)  06/29/23 191 lb 3.2 oz (86.7 kg)  06/15/23 191 lb 12.8 oz (87 kg)     GEN: Well nourished, well developed in no acute distress NECK: No JVD; No carotid bruits CARDIAC:RRR, no murmurs, rubs, gallops RESPIRATORY:  Clear to auscultation without rales, wheezing or rhonchi  ABDOMEN: Soft, non-tender, non-distended EXTREMITIES:  No edema; No deformity     ASSESSMENT AND PLAN: .    Coronary artery calcification: Detected on lung CT screening, particularly in the LAD.  Discussed the formation of calcium in arteries due to long-term exposure to plaque. He is having symptoms of shortness of breath, palpitations, and orthopnea. He has left anterior  fascicular block on EKG, no previous EKG for comparison.  He has started walking for exercise for at least 30 minutes each evening. He denies chest pain. We will get coronary CTA for ischemia evaluation.  Will have him take Lopressor 50 mg 2 hours prior. Have encouraged him to maintain walking for exercise but do not recommend increase in intensity until CT results are available.   DOE: He is having shortness of breath and orthopnea. No edema. Reports waking up during the night frequently with difficulty breathing. No history of sleep apnea. It is unclear if this is truly PND as he does not report difficulty falling back to sleep and does not appear volume overloaded on exam. He appears euvolemic on exam. He recently quit smoking and has started exercising on a consistent basis.  We will get coronary CTA for ischemia evaluation.  Consider echocardiogram if symptoms persist.  Hyperlipidemia  LDL goal < 70: Lipid panel 06/01/2023 with total cholesterol 156, HDL 37.2, LDL 102, triglycerides 82.  He discussed working on better diet and exercise with his PCP who was in agreement and has arrange 2-month follow-up to discuss.  Encouraged statin therapy if LDL is still above goal. Continue healthy plant forward diet avoiding saturated fat, sugar, fried foods, processed foods, and simple carbohydrates.  Continue to aim for 150 minutes of moderate intensity exercise each week.  CV Risk Assessment: ASCVD risk score 6.3%.  We discussed the individual risk factors. He recently stopped smoking.  He does not have a history of diabetes or hypertension. We are getting coronary CTA for ischemia evaluation. LDL goal < 70, he plans to have it rechecked in January with PCP.   Plan/Goals: Exercise recommendations: Goal of exercising for at least 30 minutes a day, at least 5 times per week.  Please exercise to a moderate exertion.  This means that while exercising it is difficult to speak in full sentences, however you are not  so short of breath that you feel you must stop, and not so comfortable that you can carry on a full conversation.  Exertion level should be approximately a 5/10, if 10 is the most exertion you can perform.  Diet recommendations: Recommend a heart healthy diet such as the Mediterranean diet limiting red meat, wine, and dairy products such as cheese that one consumes on a daily basis.        Dispo: 2-3 months with me  Signed, Eligha Bridegroom, NP-C

## 2023-07-25 ENCOUNTER — Encounter (HOSPITAL_BASED_OUTPATIENT_CLINIC_OR_DEPARTMENT_OTHER): Payer: Self-pay | Admitting: Nurse Practitioner

## 2023-07-25 ENCOUNTER — Ambulatory Visit (HOSPITAL_BASED_OUTPATIENT_CLINIC_OR_DEPARTMENT_OTHER): Payer: 59 | Admitting: Nurse Practitioner

## 2023-07-25 VITALS — BP 120/80 | HR 57 | Ht 74.5 in | Wt 195.0 lb

## 2023-07-25 DIAGNOSIS — I2510 Atherosclerotic heart disease of native coronary artery without angina pectoris: Secondary | ICD-10-CM | POA: Diagnosis not present

## 2023-07-25 DIAGNOSIS — I2584 Coronary atherosclerosis due to calcified coronary lesion: Secondary | ICD-10-CM

## 2023-07-25 DIAGNOSIS — R0609 Other forms of dyspnea: Secondary | ICD-10-CM

## 2023-07-25 DIAGNOSIS — E785 Hyperlipidemia, unspecified: Secondary | ICD-10-CM

## 2023-07-25 DIAGNOSIS — Z7189 Other specified counseling: Secondary | ICD-10-CM

## 2023-07-25 MED ORDER — METOPROLOL TARTRATE 50 MG PO TABS: ORAL_TABLET | ORAL | 0 refills | Status: DC

## 2023-07-25 NOTE — Patient Instructions (Signed)
Medication Instructions:   Your physician recommends that you continue on your current medications as directed. Please refer to the Current Medication list given to you today.   *If you need a refill on your cardiac medications before your next appointment, please call your pharmacy*   Lab Work:  TODAY!!!! BMET  If you have labs (blood work) drawn today and your tests are completely normal, you will receive your results only by: MyChart Message (if you have MyChart) OR A paper copy in the mail If you have any lab test that is abnormal or we need to change your treatment, we will call you to review the results.   Testing/Procedures:    Your cardiac CT will be scheduled at one of the below locations:   [facility] [address] [place], [place] [address] [phone]  If scheduled at [facility], please arrive at the [facility] (Entrance C2) of [facility] 30 minutes prior to test start time. You can use the FREE valet parking offered at entrance C (encouraged to control the heart rate for the test)  Proceed to the [facility] Radiology Department (first floor) to check-in and test prep.  All radiology patients and guests should use entrance C2 at [facility], accessed from [facility], even though the hospital's physical address listed is [address].    Please follow these instructions carefully (unless otherwise directed):  An IV will be required for this test and Nitroglycerin will be given.  Hold all erectile dysfunction medications at least 3 days (72 hrs) prior to test. (Ie viagra, cialis, sildenafil, tadalafil, etc)   On the Night Before the Test: Be sure to Drink plenty of water. Do not consume any caffeinated/decaffeinated beverages or chocolate 12 hours prior to your test. Do not take any antihistamines 12 hours prior to your test.  On the Day of the Test: Drink plenty of water  until 1 hour prior to the test. Do not eat any food 1 hour prior to test. You may take your regular medications prior to the test.  Take metoprolol (Lopressor) ONE TABLET BY MOUTH ( 50 MG) two hours prior to test.      After the Test: Drink plenty of water. After receiving IV contrast, you may experience a mild flushed feeling. This is normal. On occasion, you may experience a mild rash up to 24 hours after the test. This is not dangerous. If this occurs, you can take Benadryl 25 mg and increase your fluid intake. If you experience trouble breathing, this can be serious. If it is severe call 911 IMMEDIATELY. If it is mild, please call our office.  We will call to schedule your test 2-4 weeks out understanding that some insurance companies will need an authorization prior to the service being performed.   For more information and frequently asked questions, please visit our website : [email or web address]  For non-scheduling related questions, please contact the cardiac imaging nurse navigator should you have any questions/concerns: Cardiac Imaging Nurse Navigators Direct Office Dial: [phone]   For scheduling needs, including cancellations and rescheduling, please call [place], [phone].    Follow-Up: At [facility], you and your health needs are our priority.  As part of our continuing mission to provide you with exceptional heart care, we have created designated Provider Care Teams.  These Care Teams include your primary Cardiologist (physician) and Advanced Practice Providers (APPs -  Physician Assistants and Nurse Practitioners) who  all work together to provide you with the care you need, when you need it.  We recommend signing up for the patient portal called "MyChart".  Sign up information is provided on this After Visit Summary.  MyChart is used to connect with patients for Virtual Visits (Telemedicine).  Patients are able to view lab/test results,  encounter notes, upcoming appointments, etc.  Non-urgent messages can be sent to your provider as well.   To learn more about what you can do with MyChart, go to [email or web address].    Your next appointment:   2 month(s)  Provider:   Eligha Bridegroom, NP    Other Instructions GOAL: Exercise recommendations: Goal of exercising for at least 30 minutes a day, at least 5 times per week.  Please exercise to a moderate exertion.  This means that while exercising it is difficult to speak in full sentences, however you are not so short of breath that you feel you must stop, and not so comfortable that you can carry on a full conversation.  Exertion level should be approximately a 5/10, if 10 is the most exertion you can perform.  Diet recommendations: Recommend a heart healthy diet such as the Mediterranean diet.  This diet consists of plant based foods, healthy fats, lean meats, olive oil.  It suggests limiting the intake of simple carbohydrates such as white breads, pastries, and pastas.  It also limits the amount of red meat, wine, and dairy products such as cheese that one should consume on a daily basis.   Adopting a Healthy Lifestyle.   Weight: Know what a healthy weight is for you (roughly BMI <25) and aim to maintain this. You can calculate your body mass index on your smart phone. Unfortunately, this is not the most accurate measure of healthy weight, but it is the simplest measurement to use. A more accurate measurement involves body scanning which measures lean muscle, fat tissue and bony density. We do not have this equipment at [facility].    Diet: Aim for 7+ servings of fruits and vegetables daily Limit animal fats in diet for cholesterol and heart health - choose grass fed whenever available Avoid highly processed foods (fast food burgers, tacos, fried chicken, pizza, hot dogs, french fries)  Saturated fat comes in the form of butter, lard, coconut oil, margarine, partially  hydrogenated oils, and fat in meat. These increase your risk of cardiovascular disease.  Use healthy plant oils, such as olive, canola, soy, corn, sunflower and peanut.  Whole foods such as fruits, vegetables and whole grains have fiber  Men need > 38 grams of fiber per day Women need > 25 grams of fiber per day  Load up on vegetables and fruits - one-half of your plate: Aim for color and variety, and remember that potatoes dont count. Go for whole grains - one-quarter of your plate: Whole wheat, barley, wheat berries, quinoa, oats, brown rice, and foods made with them. If you want pasta, go with whole wheat pasta. Protein power - one-quarter of your plate: Fish, chicken, beans, and nuts are all healthy, versatile protein sources. Limit red meat. You need carbohydrates for energy! The type of carbohydrate is more important than the amount. Choose carbohydrates such as vegetables, fruits, whole grains, beans, and nuts in the place of white rice, white pasta, potatoes (baked or fried), macaroni and cheese, cakes, cookies, and donuts.  If youre thirsty, drink water. Coffee and tea are good in moderation, but skip sugary drinks and limit milk and dairy  products to one or two daily servings. Keep sugar intake at 6 teaspoons or 24 grams or LESS       Exercise: Aim for 150 min of moderate intensity exercise weekly for heart health, and weights twice weekly for bone health Stay active - any steps are better than no steps! Aim for 7-9 hours of sleep daily          Mediterranean Diet  Why follow it? Research shows. Those who follow the Mediterranean diet have a reduced risk of heart disease  The diet is associated with a reduced incidence of Parkinson's and Alzheimer's diseases People following the diet may have longer life expectancies and lower rates of chronic diseases  The Dietary Guidelines for Americans recommends the Mediterranean diet as an eating plan to promote health and prevent  disease  What Is the Mediterranean Diet?  Healthy eating plan based on typical foods and recipes of Mediterranean-style cooking The diet is primarily a plant based diet; these foods should make up a majority of meals   Starches - Plant based foods should make up a majority of meals - They are an important sources of vitamins, minerals, energy, antioxidants, and fiber - Choose whole grains, foods high in fiber and minimally processed items  - Typical grain sources include wheat, oats, barley, corn, brown rice, bulgar, farro, millet, polenta, couscous  - Various types of beans include chickpeas, lentils, fava beans, black beans, white beans   Fruits  Veggies - Large quantities of antioxidant rich fruits & veggies; 6 or more servings  - Vegetables can be eaten raw or lightly drizzled with oil and cooked  - Vegetables common to the traditional Mediterranean Diet include: artichokes, arugula, beets, broccoli, brussel sprouts, cabbage, carrots, celery, collard greens, cucumbers, eggplant, kale, leeks, lemons, lettuce, mushrooms, okra, onions, peas, peppers, potatoes, pumpkin, radishes, rutabaga, shallots, spinach, sweet potatoes, turnips, zucchini - Fruits common to the Mediterranean Diet include: apples, apricots, avocados, cherries, clementines, dates, figs, grapefruits, grapes, melons, nectarines, oranges, peaches, pears, pomegranates, strawberries, tangerines  Fats - Replace butter and margarine with healthy oils, such as olive oil, canola oil, and tahini  - Limit nuts to no more than a handful a day  - Nuts include walnuts, almonds, pecans, pistachios, pine nuts  - Limit or avoid candied, honey roasted or heavily salted nuts - Olives are central to the [employer] - can be eaten whole or used in a variety of dishes   Meats Protein - Limiting red meat: no more than a few times a month - When eating red meat: choose lean cuts and keep the portion to the size of deck of cards - Eggs: approx.  0 to 4 times a week  - Fish and lean poultry: at least 2 a week  - Healthy protein sources include, chicken, [place], lean beef, lamb - Increase intake of seafood such as tuna, salmon, trout, mackerel, shrimp, scallops - Avoid or limit high fat processed meats such as sausage and bacon  Dairy - Include moderate amounts of low fat dairy products  - Focus on healthy dairy such as fat free yogurt, skim milk, low or reduced fat cheese - Limit dairy products higher in fat such as whole or 2% milk, cheese, ice cream  Alcohol - Moderate amounts of red wine is ok  - No more than 5 oz daily for women (all ages) and men older than age 31  - No more than 10 oz of wine daily for men younger than 41  Other - Limit sweets and other desserts  - Use herbs and spices instead of salt to flavor foods  - Herbs and spices common to the traditional Mediterranean Diet include: basil, bay leaves, chives, cloves, cumin, fennel, garlic, lavender, marjoram, mint, oregano, parsley, pepper, rosemary, sage, savory, sumac, tarragon, thyme   It's not just a diet, it's a lifestyle:  The Mediterranean diet includes lifestyle factors typical of those in the region  Foods, drinks and meals are best eaten with others and savored Daily physical activity is important for overall good health This could be strenuous exercise like running and aerobics This could also be more leisurely activities such as walking, housework, yard-work, or taking the stairs Moderation is the key; a balanced and healthy diet accommodates most foods and drinks Consider portion sizes and frequency of consumption of certain foods   Meal Ideas & Options:  Breakfast:  Whole wheat toast or whole wheat English muffins with peanut butter & hard boiled egg Steel cut oats topped with apples & cinnamon and skim milk  Fresh fruit: banana, strawberries, melon, berries, peaches  Smoothies: strawberries, bananas, greek yogurt, peanut butter Low fat greek yogurt  with blueberries and granola  Egg white omelet with spinach and mushrooms Breakfast couscous: whole wheat couscous, apricots, skim milk, cranberries  Sandwiches:  Hummus and grilled vegetables (peppers, zucchini, squash) on whole wheat bread   Grilled chicken on whole wheat pita with lettuce, tomatoes, cucumbers or tzatziki  [place] salad on whole wheat bread: tuna salad made with greek yogurt, olives, red peppers, capers, green onions Garlic rosemary lamb pita: lamb sauted with garlic, rosemary, salt & pepper; add lettuce, cucumber, greek yogurt to pita - flavor with lemon juice and black pepper  Seafood:  Mediterranean grilled salmon, seasoned with garlic, basil, parsley, lemon juice and black pepper Shrimp, lemon, and spinach whole-grain pasta salad made with low fat greek yogurt  Seared scallops with lemon orzo  Seared tuna steaks seasoned salt, pepper, coriander topped with tomato mixture of olives, tomatoes, olive oil, minced garlic, parsley, green onions and cappers  Meats:  Herbed greek chicken salad with kalamata olives, cucumber, feta  Red bell peppers stuffed with spinach, bulgur, lean ground beef (or lentils) & topped with feta   Kebabs: skewers of chicken, tomatoes, onions, zucchini, squash  [place] burgers: made with red onions, mint, dill, lemon juice, feta cheese topped with roasted red peppers Vegetarian Cucumber salad: cucumbers, artichoke hearts, celery, red onion, feta cheese, tossed in olive oil & lemon juice  Hummus and whole grain pita points with a greek salad (lettuce, tomato, feta, olives, cucumbers, red onion) Lentil soup with celery, carrots made with vegetable broth, garlic, salt and pepper  Tabouli salad: parsley, bulgur, mint, scallions, cucumbers, tomato, radishes, lemon juice, olive oil, salt and pepper.

## 2023-07-26 LAB — BASIC METABOLIC PANEL
BUN/Creatinine Ratio: 7 — ABNORMAL LOW (ref 9–20)
BUN: 9 mg/dL (ref 6–24)
CO2: 24 mmol/L (ref 20–29)
Calcium: 9.1 mg/dL (ref 8.7–10.2)
Chloride: 107 mmol/L — ABNORMAL HIGH (ref 96–106)
Creatinine, Ser: 1.22 mg/dL (ref 0.76–1.27)
Glucose: 86 mg/dL (ref 70–99)
Potassium: 5 mmol/L (ref 3.5–5.2)
Sodium: 143 mmol/L (ref 134–144)
eGFR: 70 mL/min/{1.73_m2} (ref >59)

## 2023-08-07 ENCOUNTER — Ambulatory Visit (HOSPITAL_COMMUNITY): Payer: 59

## 2023-08-10 ENCOUNTER — Telehealth (HOSPITAL_COMMUNITY): Payer: Self-pay | Admitting: *Deleted

## 2023-08-10 NOTE — Telephone Encounter (Signed)
Reaching out to patient to offer assistance regarding upcoming cardiac imaging study; pt verbalizes understanding of appt date/time, parking situation and where to check in, pre-test NPO status and medications ordered, and verified current allergies; name and call back number provided for further questions should they arise  Larey Brick RN Navigator Cardiac Imaging [facility] [phone] office [phone] cell  Patient to take 50mg  metoprolol tartrate two hours prior to his cardiac CT scan. He is aware to arrive at 9 AM.

## 2023-08-13 ENCOUNTER — Ambulatory Visit (HOSPITAL_COMMUNITY)
Admission: RE | Admit: 2023-08-13 | Discharge: 2023-08-13 | Disposition: A | Discharge status: Home / Self Care | Payer: 59 | Source: Ambulatory Visit | Attending: Nurse Practitioner | Admitting: Nurse Practitioner

## 2023-08-13 DIAGNOSIS — R0609 Other forms of dyspnea: Secondary | ICD-10-CM | POA: Insufficient documentation

## 2023-08-13 DIAGNOSIS — I2584 Coronary atherosclerosis due to calcified coronary lesion: Secondary | ICD-10-CM | POA: Insufficient documentation

## 2023-08-13 DIAGNOSIS — I2510 Atherosclerotic heart disease of native coronary artery without angina pectoris: Secondary | ICD-10-CM | POA: Insufficient documentation

## 2023-08-13 MED ORDER — NITROGLYCERIN 0.4 MG SL SUBL
SUBLINGUAL_TABLET | SUBLINGUAL | Status: AC
  Filled 2023-08-13: qty 2

## 2023-08-13 MED ORDER — NITROGLYCERIN 0.4 MG SL SUBL
0.8000 mg | SUBLINGUAL_TABLET | SUBLINGUAL | Status: DC | PRN
  Administered 2023-08-13: 0.8 mg via SUBLINGUAL

## 2023-08-13 MED ORDER — IOHEXOL 350 MG/ML SOLN
95.0000 mL | Freq: Once | INTRAVENOUS | Status: AC | PRN
  Administered 2023-08-13: 95 mL via INTRAVENOUS

## 2023-09-17 ENCOUNTER — Ambulatory Visit: Payer: 59 | Admitting: Internal Medicine

## 2023-09-17 VITALS — BP 130/72 | HR 60 | Temp 97.3°F | Ht 74.5 in | Wt 202.6 lb

## 2023-09-17 DIAGNOSIS — R0602 Shortness of breath: Secondary | ICD-10-CM

## 2023-09-17 DIAGNOSIS — I272 Pulmonary hypertension, unspecified: Secondary | ICD-10-CM

## 2023-09-17 DIAGNOSIS — J438 Other emphysema: Secondary | ICD-10-CM

## 2023-09-17 DIAGNOSIS — D72829 Elevated white blood cell count, unspecified: Secondary | ICD-10-CM

## 2023-09-17 DIAGNOSIS — I2510 Atherosclerotic heart disease of native coronary artery without angina pectoris: Secondary | ICD-10-CM

## 2023-09-17 DIAGNOSIS — I2584 Coronary atherosclerosis due to calcified coronary lesion: Secondary | ICD-10-CM

## 2023-09-17 DIAGNOSIS — R590 Localized enlarged lymph nodes: Secondary | ICD-10-CM

## 2023-09-17 DIAGNOSIS — A048 Other specified bacterial intestinal infections: Secondary | ICD-10-CM

## 2023-09-17 DIAGNOSIS — R911 Solitary pulmonary nodule: Secondary | ICD-10-CM

## 2023-09-17 MED ORDER — NITROGLYCERIN 0.4 MG SL SUBL: 0.4000 mg | SUBLINGUAL_TABLET | SUBLINGUAL | 3 refills | Status: AC | PRN

## 2023-09-17 MED ORDER — ALBUTEROL SULFATE HFA 108 (90 BASE) MCG/ACT IN AERS: 2.0000 | INHALATION_SPRAY | Freq: Four times a day (QID) | RESPIRATORY_TRACT | 2 refills | Status: AC | PRN

## 2023-09-17 NOTE — Assessment & Plan Note (Signed)
Pulmonary Hypertension A CT scan indicated an enlarged pulmonic trunk, suggesting pulmonary arterial hypertension, potentially contributed by smoking and emphysema. Further evaluation is needed to determine the cause and appropriate treatment, including potential tests like an echocardiogram and right-sided heart catheterization. Refer to a pulmonologist and discuss pulmonary hypertension with the cardiologist.

## 2023-09-17 NOTE — Progress Notes (Signed)
==============================  [facility]: [id number]   -- Medical Office Visit --  Patient: Timothy Buckman Quinn Sr.      Age: 58 y.o.       Sex:  male  Date:   09/17/2023 Today's Healthcare Provider: Lula Olszewski, MD  ==============================   CHIEF COMPLAINT:  SUBJECTIVE: Background This is a 58 y.o. male who has Leucocytosis; TOBACCO USER; Diaphragmatic hernia; CORNS AND CALLOSITIES; DISC DISEASE, CERVICAL; History of umbilical hernia; Hematuria; Left elbow pain; Abdominal lymphadenopathy; Acute periumbilical pain; Dyslipidemia; Chronic low back pain with left-sided sciatica; H. pylori infection; Solitary pulmonary nodule on lung CT; Emphysema lung (HCC); Coronary artery disease; Pulmonary hypertension (HCC); Abnormal colonoscopy; and History of colon polyps on their problem list.  History of Present Illness The patient, with a history of coronary artery calcification in the LAD, abdominal lymphadenopathy, and emphysema, presents for a three-month follow-up. He reports adherence to prescribed Lopressor, which has alleviated some symptoms, but he continues to experience shortness of breath even at rest. The patient also mentions a history of asthma in his youth.  The patient has been maintaining an active lifestyle, walking approximately 9000 steps daily. However, he has ceased heavy lifting and intense exercise due to exacerbation of his symptoms. He has also been making dietary changes, incorporating fish, avocado, and extra virgin olive oil into his meals, in an effort to manage his LDL cholesterol levels.  The patient has a history of smoking but has been abstinent for the past five months. He has also completed a course of antibiotics for a Helicobacter pylori infection. He is due for a follow-up test to confirm eradication of the infection.  The patient has seen a cardiologist and a hematologist recently. The hematologist found no  abnormalities in his blood counts, and the cardiologist has prescribed Lopressor for his coronary artery calcification. The patient is due for a follow-up with the cardiologist and a urologist for bladder examination. He also has a lung CT scan scheduled in the coming months for a benign nodule and enlarged pulmonic trunk indicative of pulmonary arterial hypertension.   Reviewed chart records that patient  has a past medical history of Anxiety (01/14/08), Depression, DYSFNCT ASSO W/SLEEP STGES/AROUSAL FRM SLEEP (12/24/2007), and WEIGHT LOSS, ABNORMAL (11/19/2007).  Discussed Past Medical History - Enlarged lymph nodes - Coronary artery calcification - Shortness of breath - Palpitations or trouble lying flat - Hypercholesterolemia - Ex-smoker - Emphysema - Helicobacter pylori infection (stomach ulcer bacteria)  Today's Verbally Confirmed Medications - Lopressor (metoprolol) - Aspirin - Cholesterol pills Current Outpatient Medications on File Prior to Visit  Medication Sig   cyclobenzaprine (FLEXERIL) 10 MG tablet Take 1 tablet (10 mg total) by mouth 3 (three) times daily as needed for muscle spasms.   dicyclomine (BENTYL) 20 MG tablet Take 1 tablet (20 mg total) by mouth 2 (two) times daily.   HYDROcodone-acetaminophen (NORCO/VICODIN) 5-325 MG tablet Take 1 tablet by mouth every 6 (six) hours as needed.   ibuprofen (ADVIL) 800 MG tablet Take 800 mg by mouth every 6 (six) hours as needed.   metoprolol tartrate (LOPRESSOR) 50 MG tablet Take one (1) tablet by mouth ( 50 mg ) 2 hours prior to CT scan.   nicotine (NICODERM CQ - DOSED IN MG/24 HOURS) 14 mg/24hr patch RX #1 Weeks 1-6: 14 mg x 1 patch daily. Wear for 24 hours. If you have sleep disturbances, remove at bedtime.   nicotine (NICODERM CQ - DOSED IN MG/24 HR) 7  mg/24hr patch RX #2 Weeks 7-8: 7 mg x 1 patch dailyWear for 24 hours. If you have sleep disturbances, remove at bedtime..   nicotine polacrilex (COMMIT) 2 MG lozenge RX #1 Weeks  1-6: 1 lozenge every 1-2 hours. Use at least 9 lozenges per day for the first 6 weeks. Max 20 lozenges per day.   nicotine polacrilex (COMMIT) 2 MG lozenge RX #2 Weeks 7-9: 1 lozenge every 2-4 hours.Max 20 lozenges per day.   nicotine polacrilex (COMMIT) 2 MG lozenge RX #3 Weeks 10-12: 1 lozenge every 4-8 hours.Max 20 lozenges per day.   nicotine polacrilex (NICORETTE) 2 MG gum RX #1 Weeks 1-6: 1 piece every 1-2 hours.Use at least 9 pieces of gum per day for the first 6 weeks. Max 24 pieces per day.   nicotine polacrilex (NICORETTE) 2 MG gum RX #2 Weeks 7-9: 1 piece every 2-4 hours.Max 24 pieces per day.   nicotine polacrilex (NICORETTE) 2 MG gum RX #3 Weeks 10-12: 1 piece every 4-8 hours.Max 24 pieces per day.   ondansetron (ZOFRAN-ODT) 4 MG disintegrating tablet Take 1 tablet (4 mg total) by mouth every 8 (eight) hours as needed.   pantoprazole (PROTONIX) 40 MG tablet Take 40 mg by mouth daily. reported   polyethylene glycol (MIRALAX / GLYCOLAX) 17 g packet Take 17 g by mouth daily. Reported   No current facility-administered medications on file prior to visit.  There are no discontinued medications.    Objective   Physical Exam     09/17/2023    8:21 AM 08/13/2023    9:32 AM 08/13/2023    9:08 AM  Vitals with BMI  Height 6' 2.5"    Weight 202 lbs 10 oz    BMI 25.67    Systolic 130 125 742  Diastolic 72 85 91  Pulse 60     Wt Readings from Last 10 Encounters:  09/17/23 202 lb 9.6 oz (91.9 kg)  07/25/23 195 lb (88.5 kg)  06/29/23 191 lb 3.2 oz (86.7 kg)  06/15/23 191 lb 12.8 oz (87 kg)  06/01/23 187 lb 12.8 oz (85.2 kg)  05/26/23 190 lb (86.2 kg)  07/06/14 183 lb 6.4 oz (83.2 kg)  06/30/14 184 lb (83.5 kg)   Vital signs reviewed.  Nursing notes reviewed. Weight trend reviewed. General Appearance:  No acute distress appreciable.   Well-groomed, healthy-appearing male.  Well proportioned with no abnormal fat distribution.  Good muscle tone. Pulmonary:  Normal work of breathing  at rest, no respiratory distress apparent. SpO2: 99 %  Musculoskeletal: All extremities are intact.  Neurological:  Awake, alert, oriented, and engaged.  No obvious focal neurological deficits or cognitive impairments.  Sensorium seems unclouded.   Speech is clear and coherent with logical content. Psychiatric:  Appropriate mood, pleasant and cooperative demeanor, thoughtful and engaged during the exam    LABS LDL cholesterol: 102  RADIOLOGY Chest CT: coronary artery calcification in LAD, enlarged pulmonic trunk indicative of pulmonary arterial hypertension, emphysema, lung nodule (07/2023)   No results found for any visits on 09/17/23. Office Visit on 07/25/2023  Component Date Value   Glucose 07/25/2023 86    BUN 07/25/2023 9    Creatinine, Ser 07/25/2023 1.22    eGFR 07/25/2023 70    BUN/Creatinine Ratio 07/25/2023 7 (L)    Sodium 07/25/2023 143    Potassium 07/25/2023 5.0    Chloride 07/25/2023 107 (H)    CO2 07/25/2023 24    Calcium 07/25/2023 9.1   Clinical Support on 06/29/2023  Component Date Value   Flow Cytometry 06/29/2023 SEE SEPARATE REPORT    BCR ABL1 / ABL1 06/29/2023 SEE SEPARATE REPORT    Sed Rate 06/29/2023 11    LDH 06/29/2023 132    Sodium 06/29/2023 138    Potassium 06/29/2023 4.1    Chloride 06/29/2023 107    CO2 06/29/2023 27    Glucose, Bld 06/29/2023 87    BUN 06/29/2023 13    Creatinine, Ser 06/29/2023 1.18    Calcium 06/29/2023 9.3    Total Protein 06/29/2023 7.4    Albumin 06/29/2023 3.9    AST 06/29/2023 15    ALT 06/29/2023 9    Alkaline Phosphatase 06/29/2023 88    Total Bilirubin 06/29/2023 0.4    GFR, Estimated 06/29/2023 >60    Anion gap 06/29/2023 4 (L)    WBC 06/29/2023 9.7    RBC 06/29/2023 4.75    Hemoglobin 06/29/2023 14.7    HCT 06/29/2023 43.5    MCV 06/29/2023 91.6    MCH 06/29/2023 30.9    MCHC 06/29/2023 33.8    RDW 06/29/2023 14.9    Platelets 06/29/2023 195    nRBC 06/29/2023 0.0    Neutrophils Relative %  06/29/2023 51    Neutro Abs 06/29/2023 4.9    Lymphocytes Relative 06/29/2023 29    Lymphs Abs 06/29/2023 2.8    Monocytes Relative 06/29/2023 8    Monocytes Absolute 06/29/2023 0.8    Eosinophils Relative 06/29/2023 11    Eosinophils Absolute 06/29/2023 1.1 (H)    Basophils Relative 06/29/2023 1    Basophils Absolute 06/29/2023 0.1    Immature Granulocytes 06/29/2023 0    Abs Immature Granulocytes 06/29/2023 0.03    SURGICAL PATHOLOGY 06/29/2023                     Value:Surgical Pathology CASE: [id number] PATIENT: Nicholis Woodle Flow Pathology Report     Clinical history: Leukocytosis, unspecified type     DIAGNOSIS:  - No abnormal B or T-cell population identified  GATING AND PHENOTYPIC ANALYSIS:  Gated population: Flow cytometric immunophenotyping is performed using antibodies to the antigens listed in the table below. Electronic gates are placed around a cell cluster displaying light scatter properties corresponding to: lymphocytes  Abnormal Cells in gated population: N/A  Phenotype of Abnormal Cells: N/A                      Lymphoid Antigens       Myeloid Antigens Miscellaneous CD2  tested    CD10 tested    CD11b     ND   CD45 tested CD3  tested    CD19 tested    CD11c     ND   HLA-Dr    ND CD4  tested    CD20 tested    CD13 ND   CD34 tested CD5  tested    CD22 ND   CD14 ND   CD38 tested CD7  tested    CD79b     ND   CD15 ND   CD138     ND CD8  tested    CD103     ND   CD16 ND   TdT  ND CD25 ND   CD200     t                         ested    CD33 ND   CD123     ND  TCRab     ND   sKappa    tested    CD64 ND   CD41 ND TCRgd     tested    sLambda   tested    CD117     ND   CD61 ND CD56 tested    cKappa    ND   MPO  ND   CD71 ND CD57 ND   cLambda   ND        [id number]      GROSS DESCRIPTION:  One lavender top tube submitted from [facility] for lymphoma testing.    Final Diagnosis performed by Clifton James, MD.   Electronically  signed 07/03/2023 Technical and / or Professional components performed at [facility], [address], [place], [place] [address].  The above tests were developed and their performance characteristics determined by the [facility] system for the physical and immunophenotypic characterization of cell populations. They have not been cleared by the U.S. Food and Drug administration. The  FDA has determined that such clearance or approval is not necessary. This test is used for clinical purposes. It should not be  regarded as investigational or for research   Scanned Document on 06/29/2023  Component Date Value   HM Colonoscopy 06/07/2023 See Report (in chart)   Office Visit on 06/15/2023  Component Date Value   WBC 06/15/2023 9.8    RBC 06/15/2023 4.64    Hemoglobin 06/15/2023 14.0    HCT 06/15/2023 43.4    MCV 06/15/2023 93.5    MCHC 06/15/2023 32.2    RDW 06/15/2023 14.9    Platelets 06/15/2023 194.0    Neutrophils Relative % 06/15/2023 58.6    Lymphocytes Relative 06/15/2023 24.8    Monocytes Relative 06/15/2023 7.6    Eosinophils Relative 06/15/2023 7.8 (H)    Basophils Relative 06/15/2023 1.2    Neutro Abs 06/15/2023 5.7    Lymphs Abs 06/15/2023 2.4    Monocytes Absolute 06/15/2023 0.7    Eosinophils Absolute 06/15/2023 0.8 (H)    Basophils Absolute 06/15/2023 0.1    CRP 06/15/2023 <1.0   Lab on 06/13/2023  Component Date Value   TSH 06/13/2023 0.844    Hepatitis C Ab 06/13/2023 NON-REACTIVE   Office Visit on 06/01/2023  Component Date Value   Color, Urine 06/01/2023 YELLOW    APPearance 06/01/2023 CLEAR    Specific Gravity, Urine 06/01/2023 >=1.030 (A)    pH 06/01/2023 6.0    Total Protein, Urine 06/01/2023 30 (A)    Urine Glucose 06/01/2023 NEGATIVE    Ketones, ur 06/01/2023 NEGATIVE    Bilirubin Urine 06/01/2023 NEGATIVE    Hgb urine dipstick 06/01/2023 TRACE-INTACT (A)    Urobilinogen, UA 06/01/2023 0.2    Leukocytes,Ua 06/01/2023 NEGATIVE     Nitrite 06/01/2023 NEGATIVE    WBC, UA 06/01/2023 0-2/hpf    RBC / HPF 06/01/2023 0-2/hpf    Mucus, UA 06/01/2023 Presence of (A)    Squamous Epithelial / HPF 06/01/2023 Rare(0-4/hpf)    Neisseria Gonorrhea 06/01/2023 Negative    Chlamydia 06/01/2023 Negative    Trichomonas 06/01/2023 Negative    Comment 06/01/2023 Normal Reference Range Trichomonas - Negative    Comment 06/01/2023 Normal Reference Ranger Chlamydia - Negative    Comment 06/01/2023 Normal Reference Range Neisseria Gonorrhea - Negative    Path Review 06/01/2023     WBC 06/01/2023 10.2    RBC 06/01/2023 4.87    Hemoglobin 06/01/2023 14.8    HCT 06/01/2023 45.1    MCV 06/01/2023 92.6  MCHC 06/01/2023 32.8    RDW 06/01/2023 14.8    Platelets 06/01/2023 203.0    Neutrophils Relative % 06/01/2023 59.5    Lymphocytes Relative 06/01/2023 23.1    Monocytes Relative 06/01/2023 8.4    Eosinophils Relative 06/01/2023 7.7 (H)    Basophils Relative 06/01/2023 1.3    Neutro Abs 06/01/2023 6.1    Lymphs Abs 06/01/2023 2.4    Monocytes Absolute 06/01/2023 0.9    Eosinophils Absolute 06/01/2023 0.8 (H)    Basophils Absolute 06/01/2023 0.1    Sodium 06/01/2023 137    Potassium 06/01/2023 4.7    Chloride 06/01/2023 103    CO2 06/01/2023 29    Glucose, Bld 06/01/2023 91    BUN 06/01/2023 10    Creatinine, Ser 06/01/2023 1.13    Total Bilirubin 06/01/2023 0.7    Alkaline Phosphatase 06/01/2023 78    AST 06/01/2023 23    ALT 06/01/2023 14    Total Protein 06/01/2023 6.6    Albumin 06/01/2023 4.0    GFR 06/01/2023 72.72    Calcium 06/01/2023 9.3    MICRO NUMBER: 06/01/2023 [id number]    SPECIMEN QUALITY: 06/01/2023 Adequate    Sample Source 06/01/2023 NOT GIVEN    STATUS: 06/01/2023 FINAL    Result: 06/01/2023 No Growth    Sed Rate 06/01/2023 23 (H)    CRP 06/01/2023 <1.0    PSA 06/01/2023 0.80    HIV 1&2 Ab, 4th Generati* 06/01/2023 NON-REACTIVE    Vitamin B-12 06/01/2023 492    Folate 06/01/2023 7.3    Hepatitis  B Surface Ag 06/01/2023 NON-REACTIVE    Hep B S Ab 06/01/2023 REACTIVE (A)    Cholesterol 06/01/2023 156    Triglycerides 06/01/2023 82.0    HDL 06/01/2023 37.20 (L)    VLDL 06/01/2023 16.4    LDL Cholesterol 06/01/2023 102 (H)    Total CHOL/HDL Ratio 06/01/2023 4    NonHDL 06/01/2023 118.68    Immunoglobulin A 06/01/2023 511 (H)    IgG (Immunoglobin G), Se* 06/01/2023 1,586    IgM, Serum 06/01/2023 101    Anti Nuclear Antibody (A* 06/01/2023 Negative    Rheumatoid fact SerPl-aC* 06/01/2023 48 (H)    Cyclic Citrullin Peptide* 06/01/2023 <16    INTERPRETATION 06/01/2023     (tTG) Ab, IgA 06/01/2023 <1.0    Immunoglobulin A 06/01/2023 493 (H)    Tryptase 06/01/2023 9.6    Uric Acid, Serum 06/01/2023 4.1    CEA 06/01/2023 <2.0    TSH 06/01/2023 CANCELED   Admission on 05/26/2023, Discharged on 05/26/2023  Component Date Value   Lipase 05/26/2023 10 (L)    Sodium 05/26/2023 136    Potassium 05/26/2023 3.6    Chloride 05/26/2023 100    CO2 05/26/2023 24    Glucose, Bld 05/26/2023 111 (H)    BUN 05/26/2023 11    Creatinine, Ser 05/26/2023 1.08    Calcium 05/26/2023 9.7    Total Protein 05/26/2023 8.0    Albumin 05/26/2023 4.3    AST 05/26/2023 15    ALT 05/26/2023 8    Alkaline Phosphatase 05/26/2023 85    Total Bilirubin 05/26/2023 0.7    GFR, Estimated 05/26/2023 >60    Anion gap 05/26/2023 12    WBC 05/26/2023 13.9 (H)    RBC 05/26/2023 5.39    Hemoglobin 05/26/2023 16.4    HCT 05/26/2023 47.7    MCV 05/26/2023 88.5    MCH 05/26/2023 30.4    MCHC 05/26/2023 34.4    RDW 05/26/2023 14.4  Platelets 05/26/2023 219    nRBC 05/26/2023 0.0    Color, Urine 05/26/2023 YELLOW    APPearance 05/26/2023 CLEAR    Specific Gravity, Urine 05/26/2023 1.035 (H)    pH 05/26/2023 6.0    Glucose, UA 05/26/2023 NEGATIVE    Hgb urine dipstick 05/26/2023 MODERATE (A)    Bilirubin Urine 05/26/2023 NEGATIVE    Ketones, ur 05/26/2023 15 (A)    Protein, ur 05/26/2023 30 (A)    Nitrite  05/26/2023 NEGATIVE    Leukocytes,Ua 05/26/2023 NEGATIVE    RBC / HPF 05/26/2023 11-20    WBC, UA 05/26/2023 0-5    Bacteria, UA 05/26/2023 NONE SEEN    Squamous Epithelial / HPF 05/26/2023 0-5    Mucus 05/26/2023 PRESENT   No image results found. CT CORONARY MORPH W/CTA COR W/SCORE W/CA W/CM &/OR WO/CM Addendum Date: 08/29/2023 ADDENDUM REPORT: 08/29/2023 01:26 EXAM: OVER-READ INTERPRETATION  CT CHEST The following report is an over-read performed by radiologist Dr. Alcide Clever of [facility] on 08/29/2023. This over-read does not include interpretation of cardiac or coronary anatomy or pathology. The coronary calcium score/coronary CTA interpretation by the cardiologist is attached. COMPARISON:  06/18/2023 FINDINGS: Cardiovascular: There are no significant extracardiac vascular findings. Mediastinum/Nodes: There are no enlarged lymph nodes within the visualized mediastinum. Lungs/Pleura: There is no pleural effusion. The visualized lungs appear clear. Upper abdomen: No significant findings in the visualized upper abdomen. Musculoskeletal/Chest wall: No chest wall mass or suspicious osseous findings within the visualized chest. IMPRESSION: No significant extracardiac findings within the visualized chest. Electronically Signed   By: Alcide Clever M.D.   On: 08/29/2023 01:26   Result Date: 08/29/2023 HISTORY: Dyspnea on exertion (DOE) EXAM: Cardiac/Coronary CT TECHNIQUE: The patient was scanned on a [employer]. PROTOCOL: A 120 kV prospective scan was triggered in the descending thoracic aorta at 111 HU's. Axial non-contrast 3 mm slices were carried out through the heart. The data set was analyzed on a dedicated work station and scored using the Agatston method. Gantry rotation speed was 250 msecs and collimation was 0.6 mm. Heart rate was optimized medically and sl NTG was given. The 3D data set was reconstructed in 5% intervals of the 35-75 % of the R-R cycle. Systolic and diastolic  phases were analyzed on a dedicated work station using MPR, MIP and VRT modes. The patient received 95mL OMNIPAQUE IOHEXOL 350 MG/ML SOLN of contrast. FINDINGS: Coronary calcium score: The patient's coronary artery calcium score is 61, which places the patient in the 84th percentile. Coronary arteries: Normal coronary origins.  Right dominance. Right Coronary Artery: Normal caliber vessel, gives rise to PDA. Mixed calcified and noncalcified plaque in mid vessel with 1-24% stenosis. Left Main Coronary Artery: Normal caliber vessel. No significant plaque or stenosis. Left Anterior Descending Coronary Artery: Normal caliber vessel. Focal mixed calcified and noncalcified plaque in proximal vessel with positive remodeling and 1-24% luminal stenosis. Gives rise to small first and second, normal third, and small fourth diagonal branches. Noncalcified plaque at ostium of D2 with 1-24% stenosis. Left Circumflex Artery: Normal caliber vessel. No significant plaque or stenosis. Gives rise to small first and large, branching second OM branches. Aorta: Normal size, 33 mm at the mid ascending aorta (level of the PA bifurcation) measured double oblique. Aortic atherosclerosis. No dissection seen in visualized portions of the aorta. Aortic Valve: No calcifications. Trileaflet. Other findings: Normal pulmonary vein drainage into the left atrium. Normal left atrial appendage without a thrombus. Normal size of the pulmonary artery. Normal  appearance of the pericardium. IMPRESSION: 1. Minimal nonobstructive CAD, CADRADS = 1. 2. Coronary calcium score of 61. This was 84th percentile for age-, sex-, and race- matched controls. 3. Total plaque volume 101 mm3 which is 33rd percentile for age- and sex- matched controls (calcified plaque 16 mm3; noncalcified plaque 85 mm3). Total plaque volume is mild to moderate. 4. Normal coronary origin with right dominance. INTERPRETATION: CAD-RADS 1: Minimal non-obstructive CAD (1-24%). Consider  non-atherosclerotic causes of chest pain. Consider preventive therapy and risk factor modification. Electronically Signed: By: Jodelle Red M.D. On: 08/13/2023 16:17  CT CORONARY MORPH W/CTA COR W/SCORE W/CA W/CM &/OR WO/CM Addendum Date: 08/29/2023 ADDENDUM REPORT: 08/29/2023 01:26 EXAM: OVER-READ INTERPRETATION  CT CHEST The following report is an over-read performed by radiologist Dr. Alcide Clever of [facility] on 08/29/2023. This over-read does not include interpretation of cardiac or coronary anatomy or pathology. The coronary calcium score/coronary CTA interpretation by the cardiologist is attached. COMPARISON:  06/18/2023 FINDINGS: Cardiovascular: There are no significant extracardiac vascular findings. Mediastinum/Nodes: There are no enlarged lymph nodes within the visualized mediastinum. Lungs/Pleura: There is no pleural effusion. The visualized lungs appear clear. Upper abdomen: No significant findings in the visualized upper abdomen. Musculoskeletal/Chest wall: No chest wall mass or suspicious osseous findings within the visualized chest. IMPRESSION: No significant extracardiac findings within the visualized chest. Electronically Signed   By: Alcide Clever M.D.   On: 08/29/2023 01:26   Result Date: 08/29/2023 HISTORY: Dyspnea on exertion (DOE) EXAM: Cardiac/Coronary CT TECHNIQUE: The patient was scanned on a [employer]. PROTOCOL: A 120 kV prospective scan was triggered in the descending thoracic aorta at 111 HU's. Axial non-contrast 3 mm slices were carried out through the heart. The data set was analyzed on a dedicated work station and scored using the Agatston method. Gantry rotation speed was 250 msecs and collimation was 0.6 mm. Heart rate was optimized medically and sl NTG was given. The 3D data set was reconstructed in 5% intervals of the 35-75 % of the R-R cycle. Systolic and diastolic phases were analyzed on a dedicated work station using MPR, MIP and VRT modes.  The patient received 95mL OMNIPAQUE IOHEXOL 350 MG/ML SOLN of contrast. FINDINGS: Coronary calcium score: The patient's coronary artery calcium score is 61, which places the patient in the 84th percentile. Coronary arteries: Normal coronary origins.  Right dominance. Right Coronary Artery: Normal caliber vessel, gives rise to PDA. Mixed calcified and noncalcified plaque in mid vessel with 1-24% stenosis. Left Main Coronary Artery: Normal caliber vessel. No significant plaque or stenosis. Left Anterior Descending Coronary Artery: Normal caliber vessel. Focal mixed calcified and noncalcified plaque in proximal vessel with positive remodeling and 1-24% luminal stenosis. Gives rise to small first and second, normal third, and small fourth diagonal branches. Noncalcified plaque at ostium of D2 with 1-24% stenosis. Left Circumflex Artery: Normal caliber vessel. No significant plaque or stenosis. Gives rise to small first and large, branching second OM branches. Aorta: Normal size, 33 mm at the mid ascending aorta (level of the PA bifurcation) measured double oblique. Aortic atherosclerosis. No dissection seen in visualized portions of the aorta. Aortic Valve: No calcifications. Trileaflet. Other findings: Normal pulmonary vein drainage into the left atrium. Normal left atrial appendage without a thrombus. Normal size of the pulmonary artery. Normal appearance of the pericardium. IMPRESSION: 1. Minimal nonobstructive CAD, CADRADS = 1. 2. Coronary calcium score of 61. This was 84th percentile for age-, sex-, and race- matched controls. 3. Total plaque volume 101  mm3 which is 33rd percentile for age- and sex- matched controls (calcified plaque 16 mm3; noncalcified plaque 85 mm3). Total plaque volume is mild to moderate. 4. Normal coronary origin with right dominance. INTERPRETATION: CAD-RADS 1: Minimal non-obstructive CAD (1-24%). Consider non-atherosclerotic causes of chest pain. Consider preventive therapy and risk factor  modification. Electronically Signed: By: Jodelle Red M.D. On: 08/13/2023 16:17       Assessment & Plan Pulmonary hypertension (HCC) Pulmonary Hypertension A CT scan indicated an enlarged pulmonic trunk, suggesting pulmonary arterial hypertension, potentially contributed by smoking and emphysema. Further evaluation is needed to determine the cause and appropriate treatment, including potential tests like an echocardiogram and right-sided heart catheterization. Refer to a pulmonologist and discuss pulmonary hypertension with the cardiologist. Helicobacter pylori (H. pylori) infection A previous H. pylori infection was treated with antibiotics, but no follow-up test was done to confirm eradication. Order a stool sample to test for H. pylori.  Other emphysema (HCC) Emphysema Emphysema is likely related to previous smoking. The condition should not worsen since quitting smoking. It may contribute to shortness of breath but is not the primary cause at rest. Provide a handout on emphysema and continue to avoid smoking. Coronary artery disease due to calcified coronary lesion Coronary Artery Disease with LAD Calcification Coronary artery calcification in the LAD is causing shortness of breath and palpitations. Management includes metoprolol, a heart-healthy diet, and smoking cessation. Persistent shortness of breath at rest suggests the need for further intervention. Revascularization with stent placement was discussed, covering procedure details and risks of not addressing the blockage. Call the cardiologist to discuss shortness of breath and potential revascularization. Continue metoprolol, adhere to a heart-healthy diet, avoid smoking, and take aspirin and statin as prescribed. Consider nitroglycerin for shortness of breath. Abdominal lymphadenopathy Abdominal Lymphadenopathy Three-month follow-up for abdominal lymphadenopathy shows no pain, normal bowel movements, and no new  symptoms. Solitary pulmonary nodule on lung CT A lung nodule requires monitoring, with a follow-up CT scan scheduled for May.  Leukocytosis, unspecified type Previously elevated white blood cell counts have normalized. The hematologist is not concerned, and no further follow-up is required.  Shortness of breath Need him to follow up with pulmonary and cardilogy to determine source.  Will trial inhalers and nitroglycerin to see if either or both are helpful      Orders Placed During this Encounter:   Orders Placed This Encounter  Procedures   Helicobacter Pylori Special Antigen, Stool   Meds ordered this encounter  Medications   albuterol (VENTOLIN HFA) 108 (90 Base) MCG/ACT inhaler    Sig: Inhale 2 puffs into the lungs every 6 (six) hours as needed for wheezing or shortness of breath.    Dispense:  8 g    Refill:  2   nitroGLYCERIN (NITROSTAT) 0.4 MG SL tablet    Sig: Place 1 tablet (0.4 mg total) under the tongue every 5 (five) minutes as needed for chest pain (SHORTNESS OF BREATH). GO TO ER IF repeated doses x3  don't control    Dispense:  50 tablet    Refill:  3     General Health Maintenance Due for shingles and tetanus vaccinations, which are important for overall health. Get these vaccinations at the pharmacy.  Follow-up Follow up with the cardiologist on February 25, with the pulmonologist, and have a primary care follow-up in 3-4 months after the new CT scan.   This document was synthesized by artificial intelligence ([employer]) using HIPAA-compliant recording of the clinical interaction;   We  discussed the use of AI scribe software for clinical note transcription with the patient, who gave verbal consent to proceed.    Additional Info: This encounter employed state-of-the-art, real-time, collaborative documentation. The patient actively reviewed and assisted in updating their electronic medical record on a shared screen, ensuring transparency and facilitating joint  problem-solving for the problem list, overview, and plan. This approach promotes accurate, informed care. The treatment plan was discussed and reviewed in detail, including medication safety, potential side effects, and all patient questions. We confirmed understanding and comfort with the plan. Follow-up instructions were established, including contacting the office for any concerns, returning if symptoms worsen, persist, or new symptoms develop, and precautions for potential emergency department visits.

## 2023-09-17 NOTE — Assessment & Plan Note (Signed)
A lung nodule requires monitoring, with a follow-up CT scan scheduled for May.

## 2023-09-17 NOTE — Assessment & Plan Note (Signed)
Abdominal Lymphadenopathy Three-month follow-up for abdominal lymphadenopathy shows no pain, normal bowel movements, and no new symptoms.

## 2023-09-17 NOTE — Assessment & Plan Note (Signed)
Previously elevated white blood cell counts have normalized. The hematologist is not concerned, and no further follow-up is required.

## 2023-09-17 NOTE — Patient Instructions (Signed)
VISIT SUMMARY:  You came in today for your three-month follow-up. We discussed your ongoing symptoms and reviewed your current treatment plan. You have been doing well with your medications and lifestyle changes, but you are still experiencing some shortness of breath. We also reviewed your recent visits to the cardiologist and hematologist, and discussed the next steps for your care.  YOUR PLAN:  -CORONARY ARTERY DISEASE WITH LAD CALCIFICATION: Coronary artery disease with calcification in the left anterior descending artery (LAD) is causing your shortness of breath and palpitations. This condition occurs when the arteries that supply blood to your heart become hardened and narrowed. Continue taking metoprolol, follow a heart-healthy diet, avoid smoking, and take aspirin and statin as prescribed. We discussed the possibility of revascularization with stent placement to address the blockage. Please call your cardiologist to discuss your shortness of breath and potential revascularization. Consider using nitroglycerin for shortness of breath.  -PULMONARY HYPERTENSION: Pulmonary hypertension is high blood pressure in the arteries of your lungs, which can be caused by smoking and emphysema. We need further evaluation to determine the cause and appropriate treatment. You will be referred to a pulmonologist and should discuss this condition with your cardiologist. Potential tests include an echocardiogram and right-sided heart catheterization.  -EMPHYSEMA: Emphysema is a lung condition often caused by smoking that damages the air sacs in your lungs, making it hard to breathe. Since you have quit smoking, the condition should not worsen. It may contribute to your shortness of breath but is not the primary cause at rest. Please continue to avoid smoking.  -ABDOMINAL LYMPHADENOPATHY: Abdominal lymphadenopathy is the enlargement of lymph nodes in your abdomen. At this time, you have no pain, normal bowel  movements, and no new symptoms, which is a good sign.  -LUNG NODULE: A lung nodule is a small growth in the lung that needs to be monitored. You have a follow-up CT scan scheduled for May to check on this nodule.  -HELICOBACTER PYLORI INFECTION: Helicobacter pylori is a type of bacteria that can infect your stomach and cause ulcers. You were treated with antibiotics, but we need to confirm that the infection is gone. A stool sample test will be ordered to check for H. pylori.  -ELEVATED WHITE BLOOD CELL COUNT: Your previously elevated white blood cell counts have normalized, and the hematologist is not concerned. No further follow-up is required for this issue.  -GENERAL HEALTH MAINTENANCE: You are due for shingles and tetanus vaccinations, which are important for your overall health. Please get these vaccinations at the pharmacy.  INSTRUCTIONS:  Please follow up with your cardiologist on February 25 and with the pulmonologist as scheduled. Also, schedule a primary care follow-up in 3-4 months after your new CT scan. Additionally, get your shingles and tetanus vaccinations at the pharmacy.   It was a pleasure seeing you today! Your health and satisfaction are our top priorities.  Glenetta Hew, MD  Your Providers PCP: Lula Olszewski, MD,  [phone]) Referring Provider: Lula Olszewski, MD,  [phone]) Care Team Provider: Meryl Crutch, MD,  [phone]) Care Team Provider: Levi Aland, NP,  [phone])     NEXT STEPS: [x]  Early Intervention: Schedule sooner appointment, call our on-call services, or go to emergency room if there is any significant Increase in pain or discomfort New or worsening symptoms Sudden or severe changes in your health [x]  Flexible Follow-Up: We recommend a No follow-ups on file. for optimal routine care. This allows for progress monitoring and treatment adjustments. [x]  Preventive  Care: Schedule your annual preventive care visit! It's  typically covered by insurance and helps identify potential health issues early. [x]  Lab & X-ray Appointments: Incomplete tests scheduled today, or call to schedule. X-rays: [facility] (M-F, 8:30am-noon or 1pm-5pm). [x]  Medical Information Release: Sign a release form at front desk to obtain relevant medical information we don't have.  MAKING THE MOST OF OUR FOCUSED 20 MINUTE APPOINTMENTS: [x]   Clearly state your top concerns at the beginning of the visit to focus our discussion [x]   If you anticipate you will need more time, please inform the front desk during scheduling - we can book multiple appointments in the same week. [x]   If you have transportation problems- use our convenient video appointments or ask about transportation support. [x]   We can get down to business faster if you use MyChart to update information before the visit and submit non-urgent questions before your visit. Thank you for taking the time to provide details through MyChart.  Let our nurse know and she can import this information into your encounter documents.  Arrival and Wait Times: [x]   Arriving on time ensures that everyone receives prompt attention. [x]   Early morning (8a) and afternoon (1p) appointments tend to have shortest wait times. [x]   Unfortunately, we cannot delay appointments for late arrivals or hold slots during phone calls.  Getting Answers and Following Up [x]   Simple Questions & Concerns: For quick questions or basic follow-up after your visit, reach [place] at [phone] or MyChart messaging. [x]   Complex Concerns: If your concern is more complex, scheduling an appointment might be best. Discuss this with the staff to find the most suitable option. [x]   Lab & Imaging Results: We'll contact you directly if results are abnormal or you don't use MyChart. Most normal results will be on MyChart within 2-3 business days, with a review message from Dr. Jon Billings. Haven't heard back in 2 weeks?  Need results sooner? Contact us at [phone]. [x]   Referrals: Our referral coordinator will manage specialist referrals. The specialist's office should contact you within 2 weeks to schedule an appointment. Call us if you haven't heard from them after 2 weeks.  Staying Connected [x]   MyChart: Activate your MyChart for the fastest way to access results and message [place]. See the last page of this paperwork for instructions on how to activate.  Bring to Your Next Appointment [x]   Medications: Please bring all your medication bottles to your next appointment to ensure we have an accurate record of your prescriptions. [x]   Health Diaries: If you're monitoring any health conditions at home, keeping a diary of your readings can be very helpful for discussions at your next appointment.  Billing [x]   X-ray & Lab Orders: These are billed by separate companies. Contact the invoicing company directly for questions or concerns. [x]   Visit Charges: Discuss any billing inquiries with our administrative services team.  Your Satisfaction Matters [x]   Share Your Experience: We strive for your satisfaction! If you have any complaints, or preferably compliments, please let Dr. Jon Billings know directly or contact our Practice Administrators, Edwena Felty or [employer], by asking at the front desk.   Reviewing Your Records [x]   Review this early draft of your clinical encounter notes below and the final encounter summary tomorrow on MyChart after its been completed.  All orders placed so far are visible here: Pulmonary hypertension (HCC)  Helicobacter pylori (H. pylori) infection -     Helicobacter Pylori Special Antigen, Stool  Other emphysema (HCC) -     Albuterol Sulfate HFA; Inhale 2 puffs into the lungs every 6 (six) hours as needed for wheezing or shortness of breath.  Dispense: 8 g; Refill: 2  Coronary artery disease due to calcified coronary lesion -     Nitroglycerin; Place 1 tablet (0.4 mg  total) under the tongue every 5 (five) minutes as needed for chest pain (SHORTNESS OF BREATH). GO TO ER IF repeated doses x3  don't control  Dispense: 50 tablet; Refill: 3  Abdominal lymphadenopathy  Solitary pulmonary nodule on lung CT  Leukocytosis, unspecified type  Shortness of breath

## 2023-09-17 NOTE — Assessment & Plan Note (Signed)
Coronary Artery Disease with LAD Calcification Coronary artery calcification in the LAD is causing shortness of breath and palpitations. Management includes metoprolol, a heart-healthy diet, and smoking cessation. Persistent shortness of breath at rest suggests the need for further intervention. Revascularization with stent placement was discussed, covering procedure details and risks of not addressing the blockage. Call the cardiologist to discuss shortness of breath and potential revascularization. Continue metoprolol, adhere to a heart-healthy diet, avoid smoking, and take aspirin and statin as prescribed. Consider nitroglycerin for shortness of breath.

## 2023-09-17 NOTE — Assessment & Plan Note (Signed)
Emphysema Emphysema is likely related to previous smoking. The condition should not worsen since quitting smoking. It may contribute to shortness of breath but is not the primary cause at rest. Provide a handout on emphysema and continue to avoid smoking.

## 2023-09-26 ENCOUNTER — Other Ambulatory Visit: Payer: 59

## 2023-09-26 DIAGNOSIS — A048 Other specified bacterial intestinal infections: Secondary | ICD-10-CM

## 2023-09-27 LAB — HELICOBACTER PYLORI  SPECIAL ANTIGEN
MICRO NUMBER:: 16045174
SPECIMEN QUALITY: ADEQUATE

## 2023-09-28 ENCOUNTER — Inpatient Hospital Stay (HOSPITAL_BASED_OUTPATIENT_CLINIC_OR_DEPARTMENT_OTHER): Payer: 59 | Admitting: Oncology

## 2023-09-28 ENCOUNTER — Encounter: Payer: Self-pay | Admitting: Oncology

## 2023-09-28 ENCOUNTER — Inpatient Hospital Stay: Payer: 59 | Attending: Oncology

## 2023-09-28 VITALS — BP 139/85 | HR 75 | Temp 98.7°F | Resp 16 | Wt 203.9 lb

## 2023-09-28 DIAGNOSIS — D721 Eosinophilia, unspecified: Secondary | ICD-10-CM | POA: Diagnosis present

## 2023-09-28 DIAGNOSIS — D72829 Elevated white blood cell count, unspecified: Secondary | ICD-10-CM | POA: Diagnosis not present

## 2023-09-28 DIAGNOSIS — R911 Solitary pulmonary nodule: Secondary | ICD-10-CM | POA: Insufficient documentation

## 2023-09-28 DIAGNOSIS — Z87891 Personal history of nicotine dependence: Secondary | ICD-10-CM | POA: Insufficient documentation

## 2023-09-28 DIAGNOSIS — Z79899 Other long term (current) drug therapy: Secondary | ICD-10-CM | POA: Insufficient documentation

## 2023-09-28 DIAGNOSIS — A048 Other specified bacterial intestinal infections: Secondary | ICD-10-CM

## 2023-09-28 LAB — CBC WITH DIFFERENTIAL/PLATELET
Abs Immature Granulocytes: 0.03 10*3/uL (ref 0.00–0.07)
Basophils Absolute: 0.2 10*3/uL — ABNORMAL HIGH (ref 0.0–0.1)
Basophils Relative: 1 %
Eosinophils Absolute: 0.9 10*3/uL — ABNORMAL HIGH (ref 0.0–0.5)
Eosinophils Relative: 8 %
HCT: 44.5 % (ref 39.0–52.0)
Hemoglobin: 14.6 g/dL (ref 13.0–17.0)
Immature Granulocytes: 0 %
Lymphocytes Relative: 32 %
Lymphs Abs: 3.7 10*3/uL (ref 0.7–4.0)
MCH: 30.1 pg (ref 26.0–34.0)
MCHC: 32.8 g/dL (ref 30.0–36.0)
MCV: 91.8 fL (ref 80.0–100.0)
Monocytes Absolute: 0.9 10*3/uL (ref 0.1–1.0)
Monocytes Relative: 8 %
Neutro Abs: 5.9 10*3/uL (ref 1.7–7.7)
Neutrophils Relative %: 51 %
Platelets: 225 10*3/uL (ref 150–400)
RBC: 4.85 MIL/uL (ref 4.22–5.81)
RDW: 15.1 % (ref 11.5–15.5)
WBC: 11.5 10*3/uL — ABNORMAL HIGH (ref 4.0–10.5)
nRBC: 0 % (ref 0.0–0.2)

## 2023-09-28 NOTE — Assessment & Plan Note (Addendum)
-   Fluctuating white blood cell count over the years, with a recent decrease to normal levels. Eosinophils slightly elevated, possibly due to H. pylori infection.   -Clinical picture is not concerning for leukemia based on the long-standing nature of leukocytosis and absence of constitutional symptoms. Discussed various possible etiologies for leukocytosis including chronic infection, inflammation, reactionary leukocytosis, bone marrow disorders etc.   -On his initial consultation with us  on 06/29/2023, labs showed normal white count of 9700 with persistent, mild eosinophilia with absolute eosinophil count of 1100.  Flow cytometry of peripheral blood was unremarkable.  FISH testing for BCR/ABL 1 was negative.  LDH, ESR were within normal limits.  - Most likely etiology for intermittent leukocytosis in his case was previous cigarette smoking.  It could also be driven by previously undetected H. pylori infection, which is now treated appropriately.  Labs today showed mild leukocytosis with white count of 11,500, with normal differential.  Hemoglobin and platelets are within normal limits.  - Clinical picture not consistent with myeloproliferative neoplasm.  Hence we will continue monitoring alone at this time. I will plan to see him again in 6 months for repeat labs.  If stable blood counts, he can be discharged from our office after next visit.

## 2023-09-28 NOTE — Assessment & Plan Note (Signed)
-   Diagnosed on EGD on 06/07/2023.   - Completed treatment with antibiotics for 14 days. - Continue Protonix

## 2023-09-28 NOTE — Progress Notes (Signed)
 [facility]  HEMATOLOGY CLINIC PROGRESS NOTE  PATIENT NAME: Timothy UPTAIN Sr.   MR#: [id number] DOB: February 06, 1966  Patient Care Team: Timothy Bernardino MATSU, MD as PCP - General (Internal Medicine) Timothy Millman, MD as Consulting Physician (Oncology) [facility], Timothy HERO, NP as Nurse Practitioner (Cardiology)  Date of visit: 09/28/2023   ASSESSMENT & PLAN:   Timothy SHAUNNA Vincent Sr. is a 58 y.o. gentleman with past medical history of nicotine  dependence, dyslipidemia, multiple gastric ulcers, H. pylori infection, colon polyps, chronic low back pain, was referred to our clinic in November 2024 for further evaluation of elevated WBC.  Hematological workup negative.  Likely reactionary to smoking and previously untreated H. pylori infection.    Leucocytosis - Fluctuating white blood cell count over the years, with a recent decrease to normal levels. Eosinophils slightly elevated, possibly due to H. pylori infection.   -Clinical picture is not concerning for leukemia based on the long-standing nature of leukocytosis and absence of constitutional symptoms. Discussed various possible etiologies for leukocytosis including chronic infection, inflammation, reactionary leukocytosis, bone marrow disorders etc.   -On his initial consultation with us  on 06/29/2023, labs showed normal white count of 9700 with persistent, mild eosinophilia with absolute eosinophil count of 1100.  Flow cytometry of peripheral blood was unremarkable.  FISH testing for BCR/ABL 1 was negative.  LDH, ESR were within normal limits.  - Most likely etiology for intermittent leukocytosis in his case was previous cigarette smoking.  It could also be driven by previously undetected H. pylori infection, which is now treated appropriately.  Labs today showed mild leukocytosis with white count of 11,500, with normal differential.  Hemoglobin and platelets are within normal limits.  - Clinical picture not consistent with  myeloproliferative neoplasm.  Hence we will continue monitoring alone at this time. I will plan to see him again in 6 months for repeat labs.  If stable blood counts, he can be discharged from our office after next visit.  H. pylori infection - Diagnosed on EGD on 06/07/2023.   - Completed treatment with antibiotics for 14 days. - Continue Protonix  Solitary pulmonary nodule on lung CT - A 7mm right upper lobe lung nodule identified on low-dose CT scan of the lungs in October 2024. No prior scans for comparison. Patient quit smoking in October 2024. Continue to abstain from smoking and all nicotine  products.  -Repeat CT scan in 6 months (May 2025) to monitor nodule.  Patient's PCP has submitted a request for this already.    I spent a total of 25 minutes during this encounter with the patient including review of chart and various tests results, discussions about plan of care and coordination of care plan.  I reviewed lab results and outside records for this visit and discussed relevant results with the patient. Diagnosis, plan of care and treatment options were also discussed in detail with the patient. Opportunity provided to ask questions and answers provided to his apparent satisfaction. Provided instructions to call our clinic with any problems, questions or concerns prior to return visit. I recommended to continue follow-up with PCP and sub-specialists. He verbalized understanding and agreed with the plan. No barriers to learning was detected.  Hernandez Autumn, MD  09/28/2023 1:56 PM   [facility] - A DEPT OF [facility] [address] [place] [address] Dept: [phone] Dept Fax: [phone]   CHIEF COMPLAINT/ REASON FOR VISIT:  Follow-up for chronic,  mild leukocytosis, likely reactionary to past smoking and previously untreated H. pylori infection.  INTERVAL HISTORY:  Discussed the use of AI scribe software for  clinical note transcription with the patient, who gave verbal consent to proceed.   Patient returns for follow-up.  SUMMARY OF HEMATOLOGIC HISTORY:  58 y.o. gentleman with past medical history of nicotine  dependence, dyslipidemia, multiple gastric ulcers, H. pylori infection, colon polyps, chronic low back pain, was referred to our clinic in November 2024 for further evaluation of elevated WBC.   On routine labs with his PCP on 05/26/2023, white count was found to be elevated at 13,900 with ANC of 6100, eosinophil count is slightly elevated at 800.  Hemoglobin was 16.4, platelet count 219,000.  Previously patient's white count was slightly elevated at 10,700 in November 2015, 15,000-17,000 from 2009 until 2012.  Given fluctuating white count, a referral was sent to us  for further evaluation.   He had EGD and colonoscopy on 06/07/2023.  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  Negative for dysplasia.  Positive for H. pylori.  Descending colon polyp was removed and it showed polypoid colonic mucosa and negative for dysplasia.  Sigmoid colon polyp was found to be hyperplastic type.   On 06/01/2023, peripheral smear reviewed by pathologist showed eosinophilia.  No other major abnormalities.  White count was 10,200 that day, with otherwise normal differential.  ESR was 23 mm/h, slightly elevated.  CRP was undetectable.  B12, folate were within normal limits.  HIV, hepatitis C testing negative.  Hepatitis B testing showed evidence of immunity from vaccination.  ANA negative.  Rheumatoid factor was increased at 48 international units/mL.  However anti-CCP antibody was negative.  Celiac disease testing was negative.  CEA, TSH, uric acid within normal limits.   He completed antibiotic course for H. Pylori.    In addition to these issues, the patient has a small lung nodule on the right upper side, discovered during a CT scan in October 2024. The nodule  measures about 7 millimeters, and a follow-up scan is recommended in six months. The patient also has calcium  deposits in the blood vessels supplying the heart, but reports no known heart problems or symptoms.   The patient quit smoking cigars in October 2024, after a history of smoking about a pack a day. He also has a family history of cancer, with multiple relatives on his mother's side having had some form of the disease. His mother had multiple myeloma and passed away at the age of 28.   On his initial consultation with us  on 06/29/2023, labs showed normal white count of 9700 with persistent, mild eosinophilia with absolute eosinophil count of 1100.  Flow cytometry of peripheral blood was unremarkable.  FISH testing for BCR/ABL 1 was negative.  LDH, ESR were within normal limits.   Most likely etiology for intermittent leukocytosis in his case was previous cigarette smoking.  It could also be driven by previously undetected H. pylori infection, which is now treated appropriately.   Clinical picture not consistent with myeloproliferative neoplasm.  Hence we will continue monitoring alone at this time.  I have reviewed the past medical history, past surgical history, social history and family history with the patient and they are unchanged from previous note.  ALLERGIES: He has no known allergies.  MEDICATIONS:  Current Outpatient Medications  Medication Sig Dispense Refill   albuterol  (VENTOLIN  HFA) 108 (90 Base) MCG/ACT inhaler Inhale 2 puffs into the lungs every 6 (six) hours as  needed for wheezing or shortness of breath. 8 g 2   dicyclomine  (BENTYL ) 20 MG tablet Take 1 tablet (20 mg total) by mouth 2 (two) times daily. 20 tablet 0   ibuprofen (ADVIL) 800 MG tablet Take 800 mg by mouth every 6 (six) hours as needed.     metoprolol  tartrate (LOPRESSOR ) 50 MG tablet Take one (1) tablet by mouth ( 50 mg ) 2 hours prior to CT scan. 1 tablet 0   nicotine  polacrilex (NICORETTE ) 2 MG gum RX #3  Weeks 10-12: 1 piece every 4-8 hours.Max 24 pieces per day. 126 each 0   pantoprazole (PROTONIX) 40 MG tablet Take 40 mg by mouth daily. reported     polyethylene glycol (MIRALAX  / GLYCOLAX ) 17 g packet Take 17 g by mouth daily. Reported     nicotine  (NICODERM CQ  - DOSED IN MG/24 HOURS) 14 mg/24hr patch RX #1 Weeks 1-6: 14 mg x 1 patch daily. Wear for 24 hours. If you have sleep disturbances, remove at bedtime. (Patient not taking: Reported on 09/28/2023) 42 patch 0   nicotine  (NICODERM CQ  - DOSED IN MG/24 HR) 7 mg/24hr patch RX #2 Weeks 7-8: 7 mg x 1 patch dailyWear for 24 hours. If you have sleep disturbances, remove at bedtime.. (Patient not taking: Reported on 09/28/2023) 14 patch 0   nicotine  polacrilex (COMMIT) 2 MG lozenge RX #1 Weeks 1-6: 1 lozenge every 1-2 hours. Use at least 9 lozenges per day for the first 6 weeks. Max 20 lozenges per day. (Patient not taking: Reported on 09/28/2023) 1008 lozenge 0   nicotine  polacrilex (COMMIT) 2 MG lozenge RX #2 Weeks 7-9: 1 lozenge every 2-4 hours.Max 20 lozenges per day. (Patient not taking: Reported on 09/28/2023) 252 lozenge 0   nicotine  polacrilex (COMMIT) 2 MG lozenge RX #3 Weeks 10-12: 1 lozenge every 4-8 hours.Max 20 lozenges per day. (Patient not taking: Reported on 09/28/2023) 126 lozenge 0   nicotine  polacrilex (NICORETTE ) 2 MG gum RX #1 Weeks 1-6: 1 piece every 1-2 hours.Use at least 9 pieces of gum per day for the first 6 weeks. Max 24 pieces per day. (Patient not taking: Reported on 09/28/2023) 1008 each 0   nicotine  polacrilex (NICORETTE ) 2 MG gum RX #2 Weeks 7-9: 1 piece every 2-4 hours.Max 24 pieces per day. (Patient not taking: Reported on 09/28/2023) 252 each 0   nitroGLYCERIN  (NITROSTAT ) 0.4 MG SL tablet Place 1 tablet (0.4 mg total) under the tongue every 5 (five) minutes as needed for chest pain (SHORTNESS OF BREATH). GO TO ER IF repeated doses x3  don't control (Patient not taking: Reported on 09/28/2023) 50 tablet 3   ondansetron  (ZOFRAN -ODT) 4 MG  disintegrating tablet Take 1 tablet (4 mg total) by mouth every 8 (eight) hours as needed. (Patient not taking: Reported on 09/28/2023) 20 tablet 0   No current facility-administered medications for this visit.     REVIEW OF SYSTEMS:    ROS  All other pertinent systems were reviewed with the patient and are negative.  PHYSICAL EXAMINATION:    Onc Performance Status - 09/28/23 1135       ECOG Perf Status   ECOG Perf Status Fully active, able to carry on all pre-disease performance without restriction      KPS SCALE   KPS % SCORE Normal, no compliants, no evidence of disease             Vitals:   09/28/23 1120  BP: 139/85  Pulse: 75  Resp: 16  Temp:  98.7 F (37.1 C)  SpO2: 99%   Filed Weights   09/28/23 1120  Weight: 203 lb 14.4 oz (92.5 kg)    Physical Exam Constitutional:      General: He is not in acute distress.    Appearance: Normal appearance.  HENT:     Head: Normocephalic and atraumatic.  Eyes:     General: No scleral icterus.    Conjunctiva/sclera: Conjunctivae normal.  Cardiovascular:     Rate and Rhythm: Normal rate and regular rhythm.     Heart sounds: Normal heart sounds.  Pulmonary:     Effort: Pulmonary effort is normal.     Breath sounds: Normal breath sounds.  Abdominal:     General: There is no distension.     Palpations: There is no mass.  Musculoskeletal:     Right lower leg: No edema.     Left lower leg: No edema.  Lymphadenopathy:     Cervical: No cervical adenopathy.  Neurological:     General: No focal deficit present.     Mental Status: He is alert and oriented to person, place, and time.  Psychiatric:        Mood and Affect: Mood normal.        Behavior: Behavior normal.        Thought Content: Thought content normal.    LABORATORY DATA:   I have reviewed the data as listed.  Results for orders placed or performed in visit on 09/28/23  CBC with Differential/Platelet  Result Value Ref Range   WBC 11.5 (H) 4.0 -  10.5 K/uL   RBC 4.85 4.22 - 5.81 MIL/uL   Hemoglobin 14.6 13.0 - 17.0 g/dL   HCT 55.4 60.9 - 47.9 %   MCV 91.8 80.0 - 100.0 fL   MCH 30.1 26.0 - 34.0 pg   MCHC 32.8 30.0 - 36.0 g/dL   RDW 84.8 88.4 - 84.4 %   Platelets 225 150 - 400 K/uL   nRBC 0.0 0.0 - 0.2 %   Neutrophils Relative % 51 %   Neutro Abs 5.9 1.7 - 7.7 K/uL   Lymphocytes Relative 32 %   Lymphs Abs 3.7 0.7 - 4.0 K/uL   Monocytes Relative 8 %   Monocytes Absolute 0.9 0.1 - 1.0 K/uL   Eosinophils Relative 8 %   Eosinophils Absolute 0.9 (H) 0.0 - 0.5 K/uL   Basophils Relative 1 %   Basophils Absolute 0.2 (H) 0.0 - 0.1 K/uL   Immature Granulocytes 0 %   Abs Immature Granulocytes 0.03 0.00 - 0.07 K/uL    RADIOGRAPHIC STUDIES:  No recent pertinent imaging studies available to review.  Orders Placed This Encounter  Procedures   CBC with Differential (Cancer Center Only)    Standing Status:   Future    Expected Date:   03/27/2024    Expiration Date:   09/27/2024     Future Appointments  Date Time Provider Department Center  10/17/2023  8:50 AM Swinyer, Timothy HERO, NP [facility]  03/28/2024 10:30 AM [facility] None  03/28/2024 11:00 AM Merita Hawks, Chinita, MD [facility] None    This document was completed utilizing speech recognition software. Grammatical errors, random word insertions, pronoun errors, and incomplete sentences are an occasional consequence of this system due to software limitations, ambient noise, and hardware issues. Any formal questions or concerns about the content, text or information contained within the body of this dictation should be directly addressed to the provider for clarification.

## 2023-09-28 NOTE — Assessment & Plan Note (Addendum)
-   A 7mm right upper lobe lung nodule identified on low-dose CT scan of the lungs in October 2024. No prior scans for comparison. Patient quit smoking in October 2024. Continue to abstain from smoking and all nicotine  products.  -Repeat CT scan in 6 months (May 2025) to monitor nodule.  Patient's PCP has submitted a request for this already.

## 2023-10-15 NOTE — Progress Notes (Unsigned)
 Cardiology Office Note:  .   Date:  10/17/2023 ID:  Timothy Gladden Frasco Sr., DOB 08-07-1966, MRN [id number] PCP: Lula Olszewski, MD [facility] Providers Cardiologist:  None   Patient Profile: .      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 The calcification was described as 'age advanced' and was particularly heavy in the left anterior descending coronary artery. Since he got the report, he has incorporated significant lifestyle changes including quitting smoking and starting a regular exercise regimen. Also starting to incorporate healthier diet options to avoid fried food and eat more fruits and vegetables.  Diet previously consisted of frequent fast food and/or cafeteria style food.  He reported shortness of breath, particularly in the mornings and when initially starting his exercise routine. He feels some improvement when exercising but continues to have shortness of breath in the mornings. He has not experienced any chest discomfort, but does note a sensation of his heart 'pounding' or 'beating funny' when he first started exercising or when moving at a certain pace. Is sleeping at an incline due to shortness of breath when laying flat, and reports waking up around 3:30am every morning with difficulty breathing. Also occasional left arm pain that has improved with wearing a brace. No snoring that has been reported. He denied swelling in his legs, arms, or abdomen. 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 Work requires sitting for about 6 hours daily.  No regular exercise routine.  Family history of early death in his sister of unknown cause.  ASCVD risk score was calculated at 8.2%.  Due to concerning  symptoms and risk factors of hyperlipidemia and tobacco abuse, coronary CTA was ordered.  CT 08/13/2023 revealed CAC score of 61 (84th percentile), minimal nonobstructive CAD.LDL-C was 102 in October 2024.  He wanted to work on better diet and increasing exercise prior to starting statin therapy.       History of Present Illness: .   Timothy Gladden [name] is a very pleasant 58 y.o. male  who is here today for follow-up of CAD. He presents with ongoing shortness of breath. I advised that his PCP sent a note of concern about pulmonary hypertension. He quit smoking months ago and has been eating properly (most of the time - admits to occasional dietary indiscretion) and exercising by walking.  Admits however, he does get significantly short of breath at times, especially after physical exertion such as working in his garage. He also wakes up between 3 and 4 in the morning feeling like he can't breathe and has to sit up for a while, sometimes needing his inhaler. He recently found out that there is a history of heart disease in his family, with one cousin having a hole in his heart, another cousin having had a mild heart attack, and an aunt and grandfather also having heart issues. Another cousin had a stroke and open heart surgery.  He denies chest pain, edema, presyncope, syncope.  ROS: See HPI       Studies Reviewed: .          Risk Assessment/Calculations:             Physical Exam:   VS: BP 110/70 (BP Location: Left Arm,  Patient Position: Sitting, Cuff Size: Normal)   Pulse (!) 58   Ht 6' 2.5" (1.892 m)   Wt 205 lb (93 kg)   SpO2 98%   BMI 25.97 kg/m   Wt Readings from Last 3 Encounters:  10/17/23 205 lb (93 kg)  09/28/23 203 lb 14.4 oz (92.5 kg)  09/17/23 202 lb 9.6 oz (91.9 kg)     GEN: Well nourished, well developed in no acute distress NECK: No JVD; No carotid bruits CARDIAC:RRR, no murmurs, rubs, gallops RESPIRATORY:  Clear to auscultation without rales, wheezing or rhonchi   ABDOMEN: Soft, non-tender, non-distended EXTREMITIES:  No edema; No deformity     ASSESSMENT AND PLAN: .    Coronary artery calcification: Referred to cardiology for LAD calcification on lung CT screening. Due to concerning symptoms of SOB, palpitations, and orthopnea, he underwent coronary CTA 08/13/23 that revealed CAC score of 61 (84th percentile), minimal nonobstructive CAD. He has been working on improving his diet and increasing physical activity. He has chronic shortness of breath and PND. He is not having any chest pain with walking. As noted below, we will get echo to evaluate for pulmonary hypertension. LDL not at goal of <70. We are starting rosuvastatin.  Focus on secondary prevention including heart healthy mostly plant based diet avoiding saturated fat, processed foods, simple carbohydrates, and sugar along with aiming for at least 150 minutes of moderate intensity exercise each week.   DOE/Pulmonary hypertension: Chest CT 06/18/2023 revealed enlarged pulmonary trunk, indicative of pulmonary arterial hypertension.  He continues to  have shortness of breath and PND.  He is walking more for exercise and is tolerating this without significant worsening of symptoms.  Reports waking up during the night frequently with difficulty breathing. No history of sleep apnea. He is euvolemic on exam. Weight is stable. He remains off cigarettes. We will get echo to evaluate for pulmonary hypertension as well as evaluate for structural heart disease and heart function.   Hyperlipidemia  LDL goal < 70: Lipid panel 06/01/2023 with total cholesterol 156, HDL 37.2, LDL 102, triglycerides 82.  He wanted to await starting medication and work on better diet and increasing exercise.  He has not had follow-up lab work. He is agreeable to start rosuvastatin 20 mg daily.  We will get fasting NMR, LP(a), and ALT in 2 to 3 months.       Disposition: TBD following echo - consider referral to [facility]    Signed, Eligha Bridegroom, NP-C

## 2023-10-17 ENCOUNTER — Encounter (HOSPITAL_BASED_OUTPATIENT_CLINIC_OR_DEPARTMENT_OTHER): Payer: Self-pay | Admitting: Nurse Practitioner

## 2023-10-17 ENCOUNTER — Ambulatory Visit (INDEPENDENT_AMBULATORY_CARE_PROVIDER_SITE_OTHER): Payer: 59 | Admitting: Nurse Practitioner

## 2023-10-17 VITALS — BP 110/70 | HR 58 | Ht 74.5 in | Wt 205.0 lb

## 2023-10-17 DIAGNOSIS — I2584 Coronary atherosclerosis due to calcified coronary lesion: Secondary | ICD-10-CM

## 2023-10-17 DIAGNOSIS — I2510 Atherosclerotic heart disease of native coronary artery without angina pectoris: Secondary | ICD-10-CM

## 2023-10-17 DIAGNOSIS — E785 Hyperlipidemia, unspecified: Secondary | ICD-10-CM

## 2023-10-17 DIAGNOSIS — I2721 Secondary pulmonary arterial hypertension: Secondary | ICD-10-CM

## 2023-10-17 DIAGNOSIS — R0609 Other forms of dyspnea: Secondary | ICD-10-CM

## 2023-10-17 MED ORDER — ROSUVASTATIN CALCIUM 20 MG PO TABS: 20.0000 mg | ORAL_TABLET | Freq: Every day | ORAL | 3 refills | Status: AC

## 2023-10-17 NOTE — Patient Instructions (Signed)
 Medication Instructions:   START Rosuvastatin one (1) tablet by mouth ( 20 mg) daily.   *If you need a refill on your cardiac medications before your next appointment, please call your pharmacy*   Lab Work:  Your physician recommends that you return for a FASTING NMR/LPA/ALT, fasting after midnight in 2-3 months. Paperwork given to patient today.    If you have labs (blood work) drawn today and your tests are completely normal, you will receive your results only by: MyChart Message (if you have MyChart) OR A paper copy in the mail If you have any lab test that is abnormal or we need to change your treatment, we will call you to review the results.   Testing/Procedures:  Your physician has requested that you have an echocardiogram. Echocardiography is a painless test that uses sound waves to create images of your heart. It provides your doctor with information about the size and shape of your heart and how well your heart's chambers and valves are working. This procedure takes approximately one hour. There are no restrictions for this procedure. Please do NOT wear cologne, aftershave, or lotions (deodorant is allowed). Please arrive 15 minutes prior to your appointment time.   Follow-Up: At [facility], you and your health needs are our priority.  As part of our continuing mission to provide you with exceptional heart care, we have created designated Provider Care Teams.  These Care Teams include your primary Cardiologist (physician) and Advanced Practice Providers (APPs -  Physician Assistants and Nurse Practitioners) who all work together to provide you with the care you need, when you need it.  We recommend signing up for the patient portal called "MyChart".  Sign up information is provided on this After Visit Summary.  MyChart is used to connect with patients for Virtual Visits (Telemedicine).  Patients are able to view lab/test results, encounter notes, upcoming  appointments, etc.  Non-urgent messages can be sent to your provider as well.   To learn more about what you can do with MyChart, go to [email or web address].    Your next appointment:   Will contact you with date and time of next appointment after your echo results.   Provider:   Eligha Bridegroom, NP

## 2023-11-09 ENCOUNTER — Ambulatory Visit (HOSPITAL_BASED_OUTPATIENT_CLINIC_OR_DEPARTMENT_OTHER): Payer: 59

## 2023-11-09 DIAGNOSIS — R0609 Other forms of dyspnea: Secondary | ICD-10-CM

## 2023-11-09 DIAGNOSIS — E785 Hyperlipidemia, unspecified: Secondary | ICD-10-CM | POA: Diagnosis not present

## 2023-11-09 DIAGNOSIS — I2584 Coronary atherosclerosis due to calcified coronary lesion: Secondary | ICD-10-CM | POA: Diagnosis not present

## 2023-11-09 DIAGNOSIS — I2510 Atherosclerotic heart disease of native coronary artery without angina pectoris: Secondary | ICD-10-CM | POA: Diagnosis not present

## 2023-11-10 LAB — ECHOCARDIOGRAM COMPLETE
AR max vel: 2.93 cm2
AV Area VTI: 2.64 cm2
AV Area mean vel: 2.39 cm2
AV Mean grad: 3 mmHg
AV Peak grad: 5.6 mmHg
Ao pk vel: 1.18 m/s
Area-P 1/2: 4.19 cm2
S' Lateral: 4.18 cm

## 2023-11-12 ENCOUNTER — Encounter (HOSPITAL_BASED_OUTPATIENT_CLINIC_OR_DEPARTMENT_OTHER): Payer: Self-pay

## 2023-11-13 ENCOUNTER — Institutional Professional Consult (permissible substitution): Admitting: Acute Care

## 2023-11-13 ENCOUNTER — Telehealth: Payer: Self-pay

## 2023-11-13 NOTE — Telephone Encounter (Signed)
 Called pt to reschedule their appointment,pt was rescheduled,NFN

## 2023-11-20 ENCOUNTER — Other Ambulatory Visit: Payer: Self-pay | Admitting: Internal Medicine

## 2023-11-20 ENCOUNTER — Telehealth: Payer: Self-pay | Admitting: *Deleted

## 2023-11-20 DIAGNOSIS — R911 Solitary pulmonary nodule: Secondary | ICD-10-CM

## 2023-11-20 NOTE — Telephone Encounter (Signed)
 Copied from CRM [id number]. Topic: Clinical - Request for Lab/Test Order >> Nov 20, 2023 10:08 AM [place] J wrote: Reason for CRM: [facility] calling in due to an order needing to be changed.   [phone] Ext. [phone]  Returned call to [facility] and they stated order needed to be changed to CT Follow-up Nodule, not Lung Cancer Screening. Patient had screening in October, will not be able to do again until October, they are good for a year. PCP aware and order was changed.

## 2023-11-27 ENCOUNTER — Encounter: Payer: Self-pay | Admitting: Acute Care

## 2023-11-27 ENCOUNTER — Telehealth: Payer: Self-pay | Admitting: Internal Medicine

## 2023-11-27 ENCOUNTER — Ambulatory Visit: Admitting: Acute Care

## 2023-11-27 VITALS — BP 121/80 | HR 71 | Ht 74.0 in | Wt 211.8 lb

## 2023-11-27 DIAGNOSIS — F17290 Nicotine dependence, other tobacco product, uncomplicated: Secondary | ICD-10-CM

## 2023-11-27 DIAGNOSIS — R0609 Other forms of dyspnea: Secondary | ICD-10-CM | POA: Diagnosis not present

## 2023-11-27 DIAGNOSIS — R0600 Dyspnea, unspecified: Secondary | ICD-10-CM

## 2023-11-27 MED ORDER — TRELEGY ELLIPTA 100-62.5-25 MCG/ACT IN AEPB: 1.0000 | INHALATION_SPRAY | Freq: Every day | RESPIRATORY_TRACT | Status: DC

## 2023-11-27 NOTE — Patient Instructions (Addendum)
 It is good to see you today. We will order Pulmonary Function Tests to better evaluate your breathing. We will also do an overnight sleep study to better evaluate your breathing at night when you sleep.  You will get a call to schedule both. We will do a therapeutic trial of Trelegy 1  puffs one puff once daily  to see if this helps your breathing.  Rinse mouth after use.  Call if you find this helps, and we will send in a prescription. Do not use any inhalers on the day of your Pulmonary Functions Tests.  Follow up with Dr. Wynona Neat , to review PFT's and overnight sleep study to determine if you need treatment for a specific disease. Call if you need [place] sooner.  CT Chest tomorrow as scheduled through your PCP to re-evaluate the pulmonary lung nodule noted 05/2023. Please contact office for sooner follow up if symptoms do not improve or worsen or seek emergency care

## 2023-11-27 NOTE — Telephone Encounter (Unsigned)
 Copied from CRM [id number]. Topic: Clinical - Request for Lab/Test Order >> Nov 27, 2023 10:36 AM Lennart Pall wrote: Reason for CRM: [facility] calling about Prior Authorization for CT scan for tomorrow. They need it by 12pm today or the appointment will be cancelled tomorrow. [phone]

## 2023-11-27 NOTE — Progress Notes (Signed)
 History of Present Illness Timothy Sippel Penalver Sr. is a 58 y.o. male former cigar smoker ( x 28 years) with a history of childhood asthma. Referred 11/2023  for dyspnea with exertion , and when supine. He will be followed by Dr. Wynona Neat.   Pt. Has consented to use of [employer] soft wear to help capture the content of this OV    11/27/2023 Timothy Gladden Seelbach Sr. is a 58 year old male with asthma and emphysema who presents with shortness of breath.  He experiences shortness of breath, particularly when lying down or exerting himself. These symptoms began after he quit smoking in October 2024. He uses an albuterol inhaler, which provides relief. He sometimes wakes up short of breath and uses his inhaler at night. No productive cough, and symptoms do not consistently worsen with pollen exposure, though some increase is noted during high pollen times.  He has a significant smoking history, having smoked cigars for approximately 28 years, quitting in October 2024. He smoked about one pack of cigars per day, with five cigars per pack. He notes that his breathing issues began after he quit smoking.He states he is currently using his albuterol every 4 hours. He is not on maintenance therapy. He states he does often wake up from sleep needing to use his inhaler for dyspnea.   A lung cancer screening by his PCP revealed a 7.1 mm lung nodule in the right upper lobe, classified as Lung-RADS 3, with a 6 month follow-up scheduled for tomorrow. His PCP will manage. I have asked patient to have him send him back to [place] if the nodule has continued growth.  He also has a history of emphysema and smoking-related bronchiolitis. A recent echocardiogram showed an EF of 50-55% with normal RV function and trivial mitral valve regurgitation.The rest of his cardiac work up was normal.   He has a family history of allergies, asthma, and COPD, with his father using a CPAP for sleep apnea. Socially, he lives with his father, works as a  route driver, and drinks socially on weekends. 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     Test Results: LDCT Chest 06/18/2023 Lungs/Pleura: Centrilobular and paraseptal emphysema. Smoking related respiratory bronchiolitis. 7.1 mm nodule in the medial aspect of the anterior segment right upper lobe (3/149). Pulmonary nodules otherwise measure 5.6 mm or less in size. No suspicious pulmonary nodules. No pleural fluid. Airway is unremarkable.     Latest Ref Rng & Units 09/28/2023   11:03 AM 06/29/2023    2:44 PM 06/15/2023   10:04 AM  CBC  WBC 4.0 - 10.5 K/uL 11.5  9.7  9.8   Hemoglobin 13.0 - 17.0 g/dL 16.1  09.6  04.5   Hematocrit 39.0 - 52.0 % 44.5  43.5  43.4   Platelets 150 - 400 K/uL 225  195  194.0        Latest Ref Rng & Units 07/25/2023   10:55 AM 06/29/2023    2:44 PM 06/01/2023   10:06 AM  BMP  Glucose 70 - 99 mg/dL 86  87  91   BUN 6 - 24 mg/dL 9  13  10    Creatinine 0.76 - 1.27 mg/dL 4.09  8.11  9.14   BUN/Creat Ratio 9 - 20 7     Sodium 134 - 144 mmol/L 143  138  137   Potassium 3.5 - 5.2  mmol/L 5.0  4.1  4.7   Chloride 96 - 106 mmol/L 107  107  103   CO2 20 - 29 mmol/L 24  27  29    Calcium 8.7 - 10.2 mg/dL 9.1  9.3  9.3     BNP No results found for: "BNP"  ProBNP No results found for: "PROBNP"  PFT No results found for: "FEV1PRE", "FEV1POST", "FVCPRE", "FVCPOST", "TLC", "DLCOUNC", "PREFEV1FVCRT", "PSTFEV1FVCRT"  ECHOCARDIOGRAM COMPLETE Result Date: 11/10/2023    ECHOCARDIOGRAM REPORT   Patient Name:   Timothy Goerner Bartolini Sr. Date of Exam: 11/09/2023 Medical Rec #:  [id number]           Height:       74.5 in Accession #:    [id number]          Weight:       205.0 lb Date of Birth:  03/16/1966           BSA:          2.207 m Patient Age:    57 years            BP:           110/70 mmHg Patient Gender: M                   HR:           52 bpm. Exam Location:  Outpatient  Procedure: 2D Echo, Cardiac Doppler and Color Doppler (Both Spectral and Color            Flow Doppler were utilized during procedure). Indications:    R06.9 DOE  History:        Patient has no prior history of Echocardiogram examinations.                 CAD, Emphysema and Pulmonary HTN, Signs/Symptoms:Dyspnea; Risk                 Factors:Dyslipidemia and Former Smoker. Patient denies chest                 pain, and leg edema. He does have increasing DOE.  Sonographer:    Carlos American RVT, RDCS (AE), RDMS Referring Phys: [id number] [facility]  Sonographer Comments: Technically difficult study due to poor echo windows. Emphysema. IMPRESSIONS  1. Left ventricular ejection fraction, by estimation, is 50 to 55%. The left ventricle has low normal function. The left ventricle has no regional wall motion abnormalities. Left ventricular diastolic parameters were grossly normal.  2. Right ventricular systolic function is normal. The right ventricular size is normal. There is normal pulmonary artery systolic pressure. The estimated right ventricular systolic pressure is 28.2 mmHg.  3. The mitral valve is normal in structure. Trivial mitral valve regurgitation. No evidence of mitral stenosis.  4. The aortic valve is tricuspid. Aortic valve regurgitation is not visualized. No aortic stenosis is present.  5. The inferior vena cava is dilated in size with >50% respiratory variability, suggesting right atrial pressure of 8 mmHg. FINDINGS  Left Ventricle: LVEDDi 28 mm/m2, normal range. Left ventricular ejection fraction, by estimation, is 50 to 55%. The left ventricle has low normal function. The left ventricle has no regional wall motion abnormalities. The left ventricular internal cavity size was normal in size. There is no left ventricular hypertrophy. Left ventricular diastolic parameters were normal. Right Ventricle: The right ventricular size is normal. No increase in right ventricular wall thickness. Right ventricular  systolic function is normal. There is normal  pulmonary artery systolic pressure. The tricuspid regurgitant velocity is 2.25 m/s, and  with an assumed right atrial pressure of 8 mmHg, the estimated right ventricular systolic pressure is 28.2 mmHg. Left Atrium: Left atrial size was normal in size. Right Atrium: Right atrial size was normal in size. Pericardium: There is no evidence of pericardial effusion. Mitral Valve: The mitral valve is normal in structure. Trivial mitral valve regurgitation. No evidence of mitral valve stenosis. Tricuspid Valve: The tricuspid valve is normal in structure. Tricuspid valve regurgitation is trivial. No evidence of tricuspid stenosis. Aortic Valve: The aortic valve is tricuspid. Aortic valve regurgitation is not visualized. No aortic stenosis is present. Aortic valve mean gradient measures 3.0 mmHg. Aortic valve peak gradient measures 5.6 mmHg. Aortic valve area, by VTI measures 2.64 cm. Pulmonic Valve: The pulmonic valve was normal in structure. Pulmonic valve regurgitation is trivial. No evidence of pulmonic stenosis. Aorta: The aortic root is normal in size and structure. Venous: The inferior vena cava is dilated in size with greater than 50% respiratory variability, suggesting right atrial pressure of 8 mmHg. IAS/Shunts: No atrial level shunt detected by color flow Doppler.  LEFT VENTRICLE PLAX 2D LVIDd:         6.25 cm   Diastology LVIDs:         4.18 cm   LV e' medial:    7.65 cm/s LV PW:         0.85 cm   LV E/e' medial:  12.0 LV IVS:        0.84 cm   LV e' lateral:   14.40 cm/s LVOT diam:     2.30 cm   LV E/e' lateral: 6.4 LV SV:         68 LV SV Index:   31 LVOT Area:     4.15 cm  RIGHT VENTRICLE RV S prime:     13.30 cm/s TAPSE (M-mode): 2.5 cm LEFT ATRIUM             Index        RIGHT ATRIUM           Index LA diam:        3.80 cm 1.72 cm/m   RA Area:     20.40 cm LA Vol (A2C):   79.2 ml 35.89 ml/m  RA Volume:   65.80 ml  29.82 ml/m LA Vol (A4C):   56.2 ml 25.47  ml/m LA Biplane Vol: 69.9 ml 31.68 ml/m  AORTIC VALVE                    PULMONIC VALVE AV Area (Vmax):    2.93 cm     PV Vmax:       0.71 m/s AV Area (Vmean):   2.39 cm     PV Peak grad:  2.0 mmHg AV Area (VTI):     2.64 cm AV Vmax:           118.00 cm/s AV Vmean:          77.800 cm/s AV VTI:            0.257 m AV Peak Grad:      5.6 mmHg AV Mean Grad:      3.0 mmHg LVOT Vmax:         83.20 cm/s LVOT Vmean:        44.700 cm/s LVOT VTI:          0.163 m LVOT/AV VTI ratio: 0.63  AORTA  Ao Root diam: 3.60 cm Ao Asc diam:  3.40 cm MITRAL VALVE               TRICUSPID VALVE MV Area (PHT): 4.19 cm    TR Peak grad:   20.2 mmHg MV Decel Time: 181 msec    TR Vmax:        225.00 cm/s MV E velocity: 91.50 cm/s MV A velocity: 53.00 cm/s  SHUNTS MV E/A ratio:  1.73        Systemic VTI:  0.16 m                            Systemic Diam: 2.30 cm Weston Brass MD Electronically signed by Weston Brass MD Signature Date/Time: 11/10/2023/11:34:20 AM    Final      Past medical hx Past Medical History:  Diagnosis Date   Anxiety 01/14/08   Depression    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    28 cigar pack year smoking history  Vaping Use   Vaping status: Never Used  Substance Use Topics   Alcohol use: Yes    Comment: Social   Drug use: Yes    Frequency: 3.0 times per week    Types: Marijuana    Mr.Sandberg reports that he has quit smoking. His  smoking use included cigars and cigarettes. He has a 7.5 pack-year smoking history. He does not have any smokeless tobacco history on file. He reports current alcohol use. He reports current drug use. Frequency: 3.00 times per week. Drug: Marijuana.  Tobacco Cessation: Counseling given: Not Answered Tobacco comments: Quit 05/2023 Smoked 28 years  Smoked cigars mainly , 1 pack daily, 5 per pack. 28 cigar pack year smoking history Former heavy cigar smoker, quit 05/2023  Past surgical hx, Family hx, Social hx all reviewed.  Current Outpatient Medications on File Prior to Visit  Medication Sig   albuterol (VENTOLIN HFA) 108 (90 Base) MCG/ACT inhaler Inhale 2 puffs into the lungs every 6 (six) hours as needed for wheezing or shortness of breath.   dicyclomine (BENTYL) 20 MG tablet Take 1 tablet (20 mg total) by mouth 2 (two) times daily.   ibuprofen (ADVIL) 800 MG tablet Take 800 mg by mouth every 6 (six) hours as needed.   nicotine (NICODERM CQ - DOSED IN MG/24 HOURS) 14 mg/24hr patch RX #1 Weeks 1-6: 14 mg x 1 patch daily. Wear for 24 hours. If you have sleep disturbances, remove at bedtime.   nicotine (NICODERM CQ - DOSED  IN MG/24 HR) 7 mg/24hr patch RX #2 Weeks 7-8: 7 mg x 1 patch dailyWear for 24 hours. If you have sleep disturbances, remove at bedtime..   nicotine polacrilex (COMMIT) 2 MG lozenge RX #1 Weeks 1-6: 1 lozenge every 1-2 hours. Use at least 9 lozenges per day for the first 6 weeks. Max 20 lozenges per day.   nicotine polacrilex (COMMIT) 2 MG lozenge RX #2 Weeks 7-9: 1 lozenge every 2-4 hours.Max 20 lozenges per day.   nicotine polacrilex (COMMIT) 2 MG lozenge RX #3 Weeks 10-12: 1 lozenge every 4-8 hours.Max 20 lozenges per day.   nicotine polacrilex (NICORETTE) 2 MG gum RX #1 Weeks 1-6: 1 piece every 1-2 hours.Use at least 9 pieces of gum per day for the first 6 weeks. Max 24 pieces per day.   nicotine polacrilex (NICORETTE) 2 MG gum RX #2 Weeks 7-9: 1 piece every 2-4 hours.Max  24 pieces per day.   nicotine polacrilex (NICORETTE) 2 MG gum RX #3 Weeks 10-12: 1 piece every 4-8 hours.Max 24 pieces per day.   nitroGLYCERIN (NITROSTAT) 0.4 MG SL tablet Place 1 tablet (0.4 mg total) under the tongue every 5 (five) minutes as needed for chest pain (SHORTNESS OF BREATH). GO TO ER IF repeated doses x3  don't control   ondansetron (ZOFRAN-ODT) 4 MG disintegrating tablet Take 1 tablet (4 mg total) by mouth every 8 (eight) hours as needed.   pantoprazole (PROTONIX) 40 MG tablet Take 40 mg by mouth daily. reported   polyethylene glycol (MIRALAX / GLYCOLAX) 17 g packet Take 17 g by mouth daily. Reported   rosuvastatin (CRESTOR) 20 MG tablet Take 1 tablet (20 mg total) by mouth daily.   No current facility-administered medications on file prior to visit.     No Known Allergies  Review Of Systems:  Constitutional:   No  weight loss, night sweats,  Fevers, chills, fatigue, or  lassitude.  HEENT:   No headaches,  Difficulty swallowing,  Tooth/dental problems, or  Sore throat,                No sneezing, itching, ear ache, nasal congestion, post nasal drip,   CV:  No chest pain,  + Orthopnea, No PND, swelling in lower extremities, anasarca, dizziness, palpitations, syncope.   GI  No heartburn, indigestion, abdominal pain, nausea, vomiting, diarrhea, change in bowel habits, loss of appetite, bloody stools.   Resp: + shortness of breath with exertion less  at rest. Also when laying flat. No excess mucus, no productive cough,  + non-productive cough,  No coughing up of blood.  No change in color of mucus.  + wheezing.  No chest wall deformity  Skin: no rash or lesions.  GU: no dysuria, change in color of urine, no urgency or frequency.  No flank pain, no hematuria   MS:  No joint pain or swelling.  No decreased range of motion.  No back pain.  Psych:  No change in mood or affect. No depression or anxiety.  No memory loss.   Vital Signs BP 121/80 (BP Location: Left Arm,  Patient Position: Sitting, Cuff Size: Large)   Pulse 71   Ht 6\' 2"  (1.88 m)   Wt 211 lb 12.8 oz (96.1 kg)   SpO2 96%   BMI 27.19 kg/m    Physical Exam:  General- No distress,  A&Ox3, pleasant ENT: No sinus tenderness, TM clear, pale nasal mucosa, no oral exudate,no post nasal drip, no LAN Cardiac: S1, S2, regular rate and  rhythm, no murmur Chest: No wheeze/ rales/ dullness; no accessory muscle use, no nasal flaring, no sternal retractions, diminished per bases Abd.: Soft Non-tender, ND, BS +, Body mass index is 27.19 kg/m.  Ext: No clubbing cyanosis, edema, no obvious deformities Neuro:  normal strength, MAE x 4, A&O x 3, appropriate Skin: No rashes, warm and dry, no obvious lesions  Psych: normal mood and behavior   Assessment/Plan Dyspnea on exertion, and supine  in heavy cigar smoker , quit 05/2023 7 mm Pulmonary lung nodule No Maintenance inhaler Plan We will order Pulmonary Function Tests to better evaluate your breathing. We will also do an overnight sleep study to better evaluate your breathing at night when you sleep.  You will get a call to schedule both. We will do a therapeutic trial of Trelegy 1  puff once  daily to see if this helps your breathing.  Rinse mouth after use.  Call if you find this helps, and we will send in a prescription. Do not use any inhalers on the day of your Pulmonary Functions Tests.  Follow up with Dr. Wynona Neat , to review PFT's and overnight sleep study to determine if you need treatment for a specific disease. Call if you need [place] sooner.  CT Chest tomorrow as scheduled through your PCP to re-evaluate the pulmonary lung nodule noted 05/2023. Please contact office for sooner follow up if symptoms do not improve or worsen or seek emergency care    I spent 30 minutes dedicated to the care of this patient on the date of this encounter to include pre-visit review of records, face-to-face time with the patient discussing conditions above, post  visit ordering of testing, clinical documentation with the electronic health record, making appropriate referrals as documented, and communicating necessary information to the patient's healthcare team.    Bevelyn Ngo, NP 11/27/2023  1:13 PM

## 2023-11-28 ENCOUNTER — Other Ambulatory Visit

## 2023-12-12 ENCOUNTER — Encounter

## 2023-12-12 DIAGNOSIS — R0600 Dyspnea, unspecified: Secondary | ICD-10-CM

## 2023-12-18 ENCOUNTER — Encounter (HOSPITAL_BASED_OUTPATIENT_CLINIC_OR_DEPARTMENT_OTHER)

## 2023-12-23 ENCOUNTER — Telehealth: Payer: Self-pay | Admitting: Pulmonary Disease

## 2023-12-23 DIAGNOSIS — R0683 Snoring: Secondary | ICD-10-CM | POA: Diagnosis not present

## 2023-12-23 NOTE — Telephone Encounter (Signed)
 Call patient  Sleep study result  Date of study: 12/12/2023  Impression: Negative for significant sleep disordered breathing with AHI of 2.1, oxygen nadir of 84%  Recommendation: Current study does not support a diagnosis of significant sleep disordered breathing. If there remains significant clinical concern for sleep disordered breathing, an in-lab polysomnogram may be considered. Clinical follow-up of symptoms Encourage weight loss measures

## 2023-12-24 NOTE — Telephone Encounter (Signed)
 Spoke with patient regarding sleep study results    Sleep study result   Date of study: 12/12/2023   Impression: Negative for significant sleep disordered breathing with AHI of 2.1, oxygen nadir of 84%   Recommendation: Current study does not support a diagnosis of significant sleep disordered breathing. If there remains significant clinical concern for sleep disordered breathing, an in-lab polysomnogram may be considered. Clinical follow-up of symptoms Encourage weight loss measures  Patient's voice was understanding.Nothing else further needed.

## 2023-12-27 ENCOUNTER — Other Ambulatory Visit: Payer: Self-pay

## 2023-12-27 MED ORDER — TRELEGY ELLIPTA 100-62.5-25 MCG/ACT IN AEPB
1.0000 | INHALATION_SPRAY | Freq: Every day | RESPIRATORY_TRACT | 1 refills | Status: DC
  Filled 2023-12-27: qty 60, 30d supply, fill #0

## 2023-12-27 NOTE — Progress Notes (Signed)
 Refill sent.

## 2023-12-28 ENCOUNTER — Other Ambulatory Visit: Payer: Self-pay

## 2024-01-24 ENCOUNTER — Ambulatory Visit: Admitting: Pulmonary Disease

## 2024-01-24 ENCOUNTER — Encounter: Payer: Self-pay | Admitting: Pulmonary Disease

## 2024-01-24 ENCOUNTER — Ambulatory Visit (HOSPITAL_BASED_OUTPATIENT_CLINIC_OR_DEPARTMENT_OTHER): Admitting: Pulmonary Disease

## 2024-01-24 VITALS — BP 139/84 | HR 61 | Ht 74.0 in | Wt 214.0 lb

## 2024-01-24 DIAGNOSIS — R911 Solitary pulmonary nodule: Secondary | ICD-10-CM | POA: Diagnosis not present

## 2024-01-24 DIAGNOSIS — R0609 Other forms of dyspnea: Secondary | ICD-10-CM | POA: Diagnosis not present

## 2024-01-24 LAB — PULMONARY FUNCTION TEST
DL/VA % pred: 87 %
DL/VA: 3.7 ml/min/mmHg/L
DLCO cor % pred: 71 %
DLCO cor: 22.71 ml/min/mmHg
DLCO unc % pred: 71 %
DLCO unc: 22.71 ml/min/mmHg
FEF 25-75 Post: 1.53 L/s
FEF 25-75 Pre: 0.99 L/s
FEF2575-%Change-Post: 54 %
FEF2575-%Pred-Post: 43 %
FEF2575-%Pred-Pre: 28 %
FEV1-%Change-Post: 17 %
FEV1-%Pred-Post: 53 %
FEV1-%Pred-Pre: 45 %
FEV1-Post: 2.28 L
FEV1-Pre: 1.94 L
FEV1FVC-%Change-Post: 1 %
FEV1FVC-%Pred-Pre: 78 %
FEV6-%Change-Post: 14 %
FEV6-%Pred-Post: 69 %
FEV6-%Pred-Pre: 61 %
FEV6-Post: 3.73 L
FEV6-Pre: 3.25 L
FEV6FVC-%Change-Post: 0 %
FEV6FVC-%Pred-Post: 103 %
FEV6FVC-%Pred-Pre: 104 %
FVC-%Change-Post: 16 %
FVC-%Pred-Post: 68 %
FVC-%Pred-Pre: 58 %
FVC-Post: 3.78 L
FVC-Pre: 3.25 L
Post FEV1/FVC ratio: 60 %
Post FEV6/FVC ratio: 99 %
Pre FEV1/FVC ratio: 60 %
Pre FEV6/FVC Ratio: 100 %
RV % pred: 188 %
RV: 4.52 L
TLC % pred: 105 %
TLC: 8.21 L

## 2024-01-24 MED ORDER — TRELEGY ELLIPTA 200-62.5-25 MCG/ACT IN AEPB: 1.0000 | INHALATION_SPRAY | Freq: Every day | RESPIRATORY_TRACT | 3 refills | Status: DC

## 2024-01-24 NOTE — Progress Notes (Signed)
 Timothy Vandehei Chihuahua Sr.    [id number]    [birth year]  Primary Care Physician:Morrison, Randel Buss, MD  Referring Physician: Raejean Bullock, NP [address] [place],  [place] [address]  Chief complaint:   Follow-up for COPD, lung nodule  HPI:  Patient was recently seen for assessment for lung nodule Has not repeated his CT yet  Had a sleep study that is negative for significant sleep disordered breathing  Had a PFT showing severe obstructive disease with significant bronchodilator response, air trapping with mildly reduced diffusing capacity  He does get short of breath with moderate exertion  Denies any wheezing Occasional cough, not really bringing up any secretions  Sometimes does get short of breath with doing his normal chores  No chest pains or chest discomfort  Trelegy 100 did help symptoms  Outpatient Encounter Medications as of 01/24/2024  Medication Sig   albuterol  (VENTOLIN  HFA) 108 (90 Base) MCG/ACT inhaler Inhale 2 puffs into the lungs every 6 (six) hours as needed for wheezing or shortness of breath.   dicyclomine  (BENTYL ) 20 MG tablet Take 1 tablet (20 mg total) by mouth 2 (two) times daily.   Fluticasone -Umeclidin-Vilant (TRELEGY ELLIPTA ) 100-62.5-25 MCG/ACT AEPB Inhale 1 puff into the lungs daily at 2 PM.   ibuprofen (ADVIL) 800 MG tablet Take 800 mg by mouth every 6 (six) hours as needed.   nicotine  (NICODERM CQ  - DOSED IN MG/24 HOURS) 14 mg/24hr patch RX #1 Weeks 1-6: 14 mg x 1 patch daily. Wear for 24 hours. If you have sleep disturbances, remove at bedtime.   nicotine  (NICODERM CQ  - DOSED IN MG/24 HR) 7 mg/24hr patch RX #2 Weeks 7-8: 7 mg x 1 patch dailyWear for 24 hours. If you have sleep disturbances, remove at bedtime..   nicotine  polacrilex (COMMIT) 2 MG lozenge RX #1 Weeks 1-6: 1 lozenge every 1-2 hours. Use at least 9 lozenges per day for the first 6 weeks. Max 20 lozenges per day.   nicotine  polacrilex (COMMIT) 2 MG lozenge RX #2 Weeks  7-9: 1 lozenge every 2-4 hours.Max 20 lozenges per day.   nicotine  polacrilex (COMMIT) 2 MG lozenge RX #3 Weeks 10-12: 1 lozenge every 4-8 hours.Max 20 lozenges per day.   nicotine  polacrilex (NICORETTE ) 2 MG gum RX #1 Weeks 1-6: 1 piece every 1-2 hours.Use at least 9 pieces of gum per day for the first 6 weeks. Max 24 pieces per day.   nicotine  polacrilex (NICORETTE ) 2 MG gum RX #2 Weeks 7-9: 1 piece every 2-4 hours.Max 24 pieces per day.   nicotine  polacrilex (NICORETTE ) 2 MG gum RX #3 Weeks 10-12: 1 piece every 4-8 hours.Max 24 pieces per day.   nitroGLYCERIN  (NITROSTAT ) 0.4 MG SL tablet Place 1 tablet (0.4 mg total) under the tongue every 5 (five) minutes as needed for chest pain (SHORTNESS OF BREATH). GO TO ER IF repeated doses x3  don't control   ondansetron  (ZOFRAN -ODT) 4 MG disintegrating tablet Take 1 tablet (4 mg total) by mouth every 8 (eight) hours as needed.   pantoprazole (PROTONIX) 40 MG tablet Take 40 mg by mouth daily. reported   polyethylene glycol (MIRALAX  / GLYCOLAX ) 17 g packet Take 17 g by mouth daily. Reported   rosuvastatin  (CRESTOR ) 20 MG tablet Take 1 tablet (20 mg total) by mouth daily.   No facility-administered encounter medications on file as of 01/24/2024.    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   Cancer Maternal Grandmother    Cancer Maternal Grandfather        lung   COPD  Sister    Depression Sister    Diabetes Sister    Early death Sister     Social History   Socioeconomic History   Marital status: Divorced    Spouse name: Not on file   Number of children: Not on file   Years of education: Not on file   Highest education level: Some college, no degree  Occupational History   Not on file  Tobacco Use   Smoking status: Former    Current packs/day: 0.50    Average packs/day: 0.5 packs/day for 15.0 years (7.5 ttl pk-yrs)    Types: Cigars, Cigarettes   Smokeless tobacco: Not on file   Tobacco comments:    Quit 05/2023    Smoked 28 years     Smoked cigars mainly , 1 pack daily, 5 per pack.    28 cigar pack year smoking history  Vaping Use   Vaping status: Never Used  Substance and Sexual Activity   Alcohol use: Yes    Comment: Social   Drug use: Yes    Frequency: 3.0 times per week    Types: Marijuana   Sexual activity: Yes    Birth control/protection: Condom  Other Topics Concern   Not on file  Social History Narrative   Not on file   Social Drivers of Health   Financial Resource Strain: Low Risk  (09/17/2023)   Overall Financial Resource Strain (CARDIA)    Difficulty of Paying Living Expenses: Not hard at all  Food Insecurity: No Food Insecurity (09/17/2023)   Hunger Vital Sign    Worried About Running Out of Food in the Last Year: Never true    Ran Out of Food in the Last Year: Never true  Transportation Needs: No Transportation Needs (09/17/2023)   PRAPARE - Administrator, Civil Service (Medical): No    Lack of Transportation (Non-Medical): No  Physical Activity: Sufficiently Active (09/17/2023)   Exercise Vital Sign    Days of Exercise per Week: 7 days    Minutes of Exercise per Session: 40 min  Stress: Stress Concern Present (09/17/2023)   [employer] of Occupational Health - Occupational Stress Questionnaire    Feeling of Stress : To some extent  Social Connections: Moderately Isolated (09/17/2023)   Social  Connection and Isolation Panel [NHANES]    Frequency of Communication with Friends and Family: More than three times a week  Frequency of Social Gatherings with Friends and Family: Twice a week    Attends Religious Services: More than 4 times per year    Active Member of [employer] or Organizations: No    Attends Engineer, structural: Not on file    Marital Status: Divorced  Intimate Partner Violence: Not At Risk (06/29/2023)   Humiliation, Afraid, Rape, and Kick questionnaire    Fear of Current or Ex-Partner: No    Emotionally Abused: No    Physically Abused: No    Sexually Abused: No    Review of Systems  Respiratory:  Positive for shortness of breath.     Vitals:   01/24/24 1450  BP: 139/84  Pulse: 61  SpO2: 95%     Physical Exam Constitutional:      Appearance: He is obese.  HENT:     Head: Normocephalic.     Mouth/Throat:     Mouth: Mucous membranes are moist.  Eyes:     General: No scleral icterus. Cardiovascular:     Rate and Rhythm: Normal rate and regular rhythm.     Heart sounds: No murmur heard.    No friction rub.  Pulmonary:     Effort: No respiratory distress.     Breath sounds: No stridor. No wheezing or rhonchi.  Musculoskeletal:     Cervical back: No rigidity or tenderness.  Neurological:     Mental Status: He is alert.  Psychiatric:        Mood and Affect: Mood normal.      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

## 2024-01-24 NOTE — Patient Instructions (Signed)
 Full PFT performed today.

## 2024-01-24 NOTE — Progress Notes (Signed)
 Full PFT performed today.

## 2024-01-24 NOTE — Patient Instructions (Signed)
 I will see you back in about 3 months  We will increase your Trelegy to 200 from 100, it is still once a day  Use albuterol  as needed  Graded exercise as tolerated  Call us  with significant concerns  We need to repeat your CT scan to ensure that the small spot in the lung is stable

## 2024-01-29 ENCOUNTER — Ambulatory Visit
Admission: RE | Admit: 2024-01-29 | Discharge: 2024-01-29 | Disposition: A | Discharge status: Home / Self Care | Source: Ambulatory Visit | Attending: Pulmonary Disease | Admitting: Pulmonary Disease

## 2024-01-29 DIAGNOSIS — R911 Solitary pulmonary nodule: Secondary | ICD-10-CM

## 2024-03-13 ENCOUNTER — Ambulatory Visit: Payer: Self-pay | Admitting: Pulmonary Disease

## 2024-03-14 ENCOUNTER — Other Ambulatory Visit: Payer: Self-pay | Admitting: Pulmonary Disease

## 2024-03-14 DIAGNOSIS — R911 Solitary pulmonary nodule: Secondary | ICD-10-CM

## 2024-03-24 ENCOUNTER — Ambulatory Visit
Admission: RE | Admit: 2024-03-24 | Discharge: 2024-03-24 | Disposition: A | Discharge status: Home / Self Care | Source: Ambulatory Visit | Attending: Pulmonary Disease

## 2024-03-24 DIAGNOSIS — R911 Solitary pulmonary nodule: Secondary | ICD-10-CM

## 2024-03-28 ENCOUNTER — Ambulatory Visit: Payer: 59 | Admitting: Oncology

## 2024-03-28 ENCOUNTER — Other Ambulatory Visit: Payer: 59

## 2024-04-03 ENCOUNTER — Telehealth: Payer: Self-pay | Admitting: Oncology

## 2024-04-03 NOTE — Telephone Encounter (Signed)
 Timothy Hernandez lvm to re-schedule his appointment.

## 2024-04-03 NOTE — Telephone Encounter (Signed)
 I returned Timothy Hernandez's call and re-scheduled his appointments.

## 2024-04-04 ENCOUNTER — Inpatient Hospital Stay

## 2024-04-04 ENCOUNTER — Inpatient Hospital Stay: Admitting: Oncology

## 2024-04-08 ENCOUNTER — Other Ambulatory Visit: Payer: Self-pay

## 2024-04-08 DIAGNOSIS — D72829 Elevated white blood cell count, unspecified: Secondary | ICD-10-CM

## 2024-04-09 ENCOUNTER — Encounter: Payer: Self-pay | Admitting: Oncology

## 2024-04-09 ENCOUNTER — Inpatient Hospital Stay (HOSPITAL_BASED_OUTPATIENT_CLINIC_OR_DEPARTMENT_OTHER): Admitting: Oncology

## 2024-04-09 ENCOUNTER — Inpatient Hospital Stay: Attending: Oncology

## 2024-04-09 VITALS — BP 128/80 | HR 53 | Temp 98.7°F | Resp 17 | Wt 213.9 lb

## 2024-04-09 DIAGNOSIS — D72829 Elevated white blood cell count, unspecified: Secondary | ICD-10-CM | POA: Diagnosis not present

## 2024-04-09 DIAGNOSIS — Z87891 Personal history of nicotine dependence: Secondary | ICD-10-CM | POA: Diagnosis not present

## 2024-04-09 DIAGNOSIS — Z79899 Other long term (current) drug therapy: Secondary | ICD-10-CM | POA: Insufficient documentation

## 2024-04-09 DIAGNOSIS — R918 Other nonspecific abnormal finding of lung field: Secondary | ICD-10-CM | POA: Diagnosis not present

## 2024-04-09 LAB — CBC WITH DIFFERENTIAL (CANCER CENTER ONLY)
Abs Immature Granulocytes: 0.03 K/uL (ref 0.00–0.07)
Basophils Absolute: 0.1 K/uL (ref 0.0–0.1)
Basophils Relative: 1 %
Eosinophils Absolute: 0.6 K/uL — ABNORMAL HIGH (ref 0.0–0.5)
Eosinophils Relative: 6 %
HCT: 44.5 % (ref 39.0–52.0)
Hemoglobin: 14.9 g/dL (ref 13.0–17.0)
Immature Granulocytes: 0 %
Lymphocytes Relative: 35 %
Lymphs Abs: 3.5 K/uL (ref 0.7–4.0)
MCH: 30.2 pg (ref 26.0–34.0)
MCHC: 33.5 g/dL (ref 30.0–36.0)
MCV: 90.1 fL (ref 80.0–100.0)
Monocytes Absolute: 0.8 K/uL (ref 0.1–1.0)
Monocytes Relative: 8 %
Neutro Abs: 4.9 K/uL (ref 1.7–7.7)
Neutrophils Relative %: 50 %
Platelet Count: 203 K/uL (ref 150–400)
RBC: 4.94 MIL/uL (ref 4.22–5.81)
RDW: 15 % (ref 11.5–15.5)
WBC Count: 10 K/uL (ref 4.0–10.5)
nRBC: 0 % (ref 0.0–0.2)

## 2024-04-09 NOTE — Assessment & Plan Note (Signed)
-   Fluctuating white blood cell count over the years, with a recent decrease to normal levels. Eosinophils slightly elevated, possibly due to H. pylori infection.   -Clinical picture is not concerning for leukemia based on the long-standing nature of leukocytosis and absence of constitutional symptoms. Discussed various possible etiologies for leukocytosis including chronic infection, inflammation, reactionary leukocytosis, bone marrow disorders etc.   -On his initial consultation with us  on 06/29/2023, labs showed normal white count of 9700 with persistent, mild eosinophilia with absolute eosinophil count of 1100.  Flow cytometry of peripheral blood was unremarkable.  FISH testing for BCR/ABL 1 was negative.  LDH, ESR were within normal limits.  - Most likely etiology for intermittent leukocytosis in his case was previous cigarette smoking.  It could also be driven by previously undetected H. pylori infection, which is now treated appropriately.  Labs today showed normal white count of 10,000 with normal differential.  Hemoglobin and platelets are within normal limits.  - Clinical picture not consistent with myeloproliferative neoplasm.    Given stable blood counts, no additional hematological intervention or workup is warranted.  Patient preferred to continue to follow-up with his PCP.  Please reconsult us  as necessary.

## 2024-04-09 NOTE — Assessment & Plan Note (Signed)
-   A 7mm right upper lobe lung nodule identified on low-dose CT scan of the lungs in October 2024. No prior scans for comparison. Patient quit smoking in October 2024. Continue to abstain from smoking and all nicotine  products.  - Repeat CT of the chest on 03/24/2024 showed stable 7 mm left upper lobe lung nodule.  There were few other lung nodules which were new and measured up to 6 mm.  - He will need a repeat CT of the chest in 3 months.  Patient stated that he will follow-up with his PCP regarding this.

## 2024-04-09 NOTE — Progress Notes (Signed)
 [facility]  HEMATOLOGY CLINIC PROGRESS NOTE  PATIENT NAME: Timothy BLUESTEIN Sr.   MR#: [id number] DOB: 1965-12-14  Patient Care Team: Timothy Bernardino MATSU, MD as PCP - General (Internal Medicine) Autumn Millman, MD as Consulting Physician (Oncology) Swinyer, Rosaline HERO, NP as Nurse Practitioner (Cardiology)  Date of visit: 04/09/2024   ASSESSMENT & PLAN:   Timothy SHAUNNA Giel Sr. is a 58 y.o. gentleman with past medical history of nicotine  dependence, dyslipidemia, multiple gastric ulcers, H. pylori infection, colon polyps, chronic low back pain, was referred to our clinic in November 2024 for further evaluation of elevated WBC.  Hematological workup negative.  Likely reactionary to smoking and previously untreated H. pylori infection.    Hx of Leucocytosis - Fluctuating white blood cell count over the years, with a recent decrease to normal levels. Eosinophils slightly elevated, possibly due to H. pylori infection.   -Clinical picture is not concerning for leukemia based on the long-standing nature of leukocytosis and absence of constitutional symptoms. Discussed various possible etiologies for leukocytosis including chronic infection, inflammation, reactionary leukocytosis, bone marrow disorders etc.   -On his initial consultation with us  on 06/29/2023, labs showed normal white count of 9700 with persistent, mild eosinophilia with absolute eosinophil count of 1100.  Flow cytometry of peripheral blood was unremarkable.  FISH testing for BCR/ABL 1 was negative.  LDH, ESR were within normal limits.  - Most likely etiology for intermittent leukocytosis in his case was previous cigarette smoking.  It could also be driven by previously undetected H. pylori infection, which is now treated appropriately.  Labs today showed normal white count of 10,000 with normal differential.  Hemoglobin and platelets are within normal limits.  - Clinical picture not consistent with myeloproliferative  neoplasm.    Given stable blood counts, no additional hematological intervention or workup is warranted.  Patient preferred to continue to follow-up with his PCP.  Please reconsult us  as necessary.  Multiple lung nodules on CT - A 7mm right upper lobe lung nodule identified on low-dose CT scan of the lungs in October 2024. No prior scans for comparison. Patient quit smoking in October 2024. Continue to abstain from smoking and all nicotine  products.  - Repeat CT of the chest on 03/24/2024 showed stable 7 mm left upper lobe lung nodule.  There were few other lung nodules which were new and measured up to 6 mm.  - He will need a repeat CT of the chest in 3 months.  Patient stated that he will follow-up with his PCP regarding this.    I spent a total of 20 minutes during this encounter with the patient including review of chart and various tests results, discussions about plan of care and coordination of care plan.  I reviewed lab results and outside records for this visit and discussed relevant results with the patient. Diagnosis, plan of care and treatment options were also discussed in detail with the patient. Opportunity provided to ask questions and answers provided to his apparent satisfaction. Provided instructions to call our clinic with any problems, questions or concerns prior to return visit. I recommended to continue follow-up with PCP and sub-specialists. He verbalized understanding and agreed with the plan. No barriers to learning was detected.  Millman Autumn, MD  04/09/2024 2:08 PM  [facility] - A DEPT OF [facility] [address] [place] [address] Dept: [phone] Dept Fax: [phone]   CHIEF COMPLAINT/  REASON FOR VISIT:  Follow-up for chronic, mild leukocytosis, likely reactionary to past smoking and previously untreated H. pylori infection.  INTERVAL HISTORY:  Discussed the use of AI scribe software  for clinical note transcription with the patient, who gave verbal consent to proceed.  History of Present Illness Timothy Duvall Mccown Sr. is a 58 year old male who presents for follow-up after treatment for H. pylori and monitoring of lung nodules. He was referred by Dr. Jesus for monitoring of lung nodules.  He completed treatment for H. pylori infection and has no major concerns since then. He is eating well and maintaining his weight.  He quit smoking in October of the previous year. He was initially seen for a high white blood cell count.  A CT scan of the chest, ordered by his primary doctor, showed a stable main lung nodule. However, there are a few new small nodules that require follow-up with another scan in three to six months.  He has no new symptoms to report.    SUMMARY OF HEMATOLOGIC HISTORY:  58 y.o. gentleman with past medical history of nicotine  dependence, dyslipidemia, multiple gastric ulcers, H. pylori infection, colon polyps, chronic low back pain, was referred to our clinic in November 2024 for further evaluation of elevated WBC.   On routine labs with his PCP on 05/26/2023, white count was found to be elevated at 13,900 with ANC of 6100, eosinophil count is slightly elevated at 800.  Hemoglobin was 16.4, platelet count 219,000.  Previously patient's white count was slightly elevated at 10,700 in November 2015, 15,000-17,000 from 2009 until 2012.  Given fluctuating white count, a referral was sent to us  for further evaluation.   He had EGD and colonoscopy on 06/07/2023.  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  Negative for dysplasia.  Positive for H. pylori.  Descending colon polyp was removed and it showed polypoid colonic mucosa and negative for dysplasia.  Sigmoid colon polyp was found to be hyperplastic type.   On 06/01/2023, peripheral smear reviewed by pathologist showed eosinophilia.  No other major abnormalities.   White count was 10,200 that day, with otherwise normal differential.  ESR was 23 mm/h, slightly elevated.  CRP was undetectable.  B12, folate were within normal limits.  HIV, hepatitis C testing negative.  Hepatitis B testing showed evidence of immunity from vaccination.  ANA negative.  Rheumatoid factor was increased at 48 international units/mL.  However anti-CCP antibody was negative.  Celiac disease testing was negative.  CEA, TSH, uric acid within normal limits.   He completed antibiotic course for H. Pylori.    In addition to these issues, the patient has a small lung nodule on the right upper side, discovered during a CT scan in October 2024. The nodule measures about 7 millimeters, and a follow-up scan is recommended in six months. The patient also has calcium  deposits in the blood vessels supplying the heart, but reports no known heart problems or symptoms.   The patient quit smoking cigars in October 2024, after a history of smoking about a pack a day. He also has a family history of cancer, with multiple relatives on his mother's side having had some form of the disease. His mother had multiple myeloma and passed away at the age of 28.   On his initial consultation with us  on 06/29/2023, labs showed normal white count of 9700 with persistent, mild eosinophilia with absolute eosinophil count of 1100.  Flow cytometry of peripheral blood was  unremarkable.  FISH testing for BCR/ABL 1 was negative.  LDH, ESR were within normal limits.   Most likely etiology for intermittent leukocytosis in his case was previous cigarette smoking.  It could also be driven by previously undetected H. pylori infection, which is now treated appropriately.   Clinical picture not consistent with myeloproliferative neoplasm.  Hence we will continue monitoring alone at this time.  I have reviewed the past medical history, past surgical history, social history and family history with the patient and they are unchanged from  previous note.  ALLERGIES: He has no known allergies.  MEDICATIONS:  Current Outpatient Medications  Medication Sig Dispense Refill   albuterol  (VENTOLIN  HFA) 108 (90 Base) MCG/ACT inhaler Inhale 2 puffs into the lungs every 6 (six) hours as needed for wheezing or shortness of breath. 8 g 2   dicyclomine  (BENTYL ) 20 MG tablet Take 1 tablet (20 mg total) by mouth 2 (two) times daily. 20 tablet 0   Fluticasone -Umeclidin-Vilant (TRELEGY ELLIPTA ) 200-62.5-25 MCG/ACT AEPB Inhale 1 puff into the lungs daily. 60 each 3   ibuprofen (ADVIL) 800 MG tablet Take 800 mg by mouth every 6 (six) hours as needed.     nicotine  (NICODERM CQ  - DOSED IN MG/24 HOURS) 14 mg/24hr patch RX #1 Weeks 1-6: 14 mg x 1 patch daily. Wear for 24 hours. If you have sleep disturbances, remove at bedtime. 42 patch 0   nicotine  (NICODERM CQ  - DOSED IN MG/24 HR) 7 mg/24hr patch RX #2 Weeks 7-8: 7 mg x 1 patch dailyWear for 24 hours. If you have sleep disturbances, remove at bedtime.. 14 patch 0   nicotine  polacrilex (COMMIT) 2 MG lozenge RX #1 Weeks 1-6: 1 lozenge every 1-2 hours. Use at least 9 lozenges per day for the first 6 weeks. Max 20 lozenges per day. 1008 lozenge 0   nicotine  polacrilex (COMMIT) 2 MG lozenge RX #2 Weeks 7-9: 1 lozenge every 2-4 hours.Max 20 lozenges per day. 252 lozenge 0   nicotine  polacrilex (COMMIT) 2 MG lozenge RX #3 Weeks 10-12: 1 lozenge every 4-8 hours.Max 20 lozenges per day. 126 lozenge 0   nicotine  polacrilex (NICORETTE ) 2 MG gum RX #1 Weeks 1-6: 1 piece every 1-2 hours.Use at least 9 pieces of gum per day for the first 6 weeks. Max 24 pieces per day. 1008 each 0   nicotine  polacrilex (NICORETTE ) 2 MG gum RX #2 Weeks 7-9: 1 piece every 2-4 hours.Max 24 pieces per day. 252 each 0   nicotine  polacrilex (NICORETTE ) 2 MG gum RX #3 Weeks 10-12: 1 piece every 4-8 hours.Max 24 pieces per day. 126 each 0   nitroGLYCERIN  (NITROSTAT ) 0.4 MG SL tablet Place 1 tablet (0.4 mg total) under the tongue every 5  (five) minutes as needed for chest pain (SHORTNESS OF BREATH). GO TO ER IF repeated doses x3  don't control 50 tablet 3   ondansetron  (ZOFRAN -ODT) 4 MG disintegrating tablet Take 1 tablet (4 mg total) by mouth every 8 (eight) hours as needed. 20 tablet 0   pantoprazole (PROTONIX) 40 MG tablet Take 40 mg by mouth daily. reported     polyethylene glycol (MIRALAX  / GLYCOLAX ) 17 g packet Take 17 g by mouth daily. Reported     rosuvastatin  (CRESTOR ) 20 MG tablet Take 1 tablet (20 mg total) by mouth daily. 90 tablet 3   No current facility-administered medications for this visit.     REVIEW OF SYSTEMS:    ROS  All other pertinent systems were reviewed with the  patient and are negative.  PHYSICAL EXAMINATION:    Onc Performance Status - 04/09/24 0926       ECOG Perf Status   ECOG Perf Status Fully active, able to carry on all pre-disease performance without restriction      KPS SCALE   KPS % SCORE Normal, no compliants, no evidence of disease           Vitals:   04/09/24 0836  BP: 128/80  Pulse: (!) 53  Resp: 17  Temp: 98.7 F (37.1 C)  SpO2: 99%   Filed Weights   04/09/24 0836  Weight: 213 lb 14.4 oz (97 kg)    Physical Exam Constitutional:      General: He is not in acute distress.    Appearance: Normal appearance.  HENT:     Head: Normocephalic and atraumatic.  Eyes:     General: No scleral icterus.    Conjunctiva/sclera: Conjunctivae normal.  Cardiovascular:     Rate and Rhythm: Normal rate and regular rhythm.     Heart sounds: Normal heart sounds.  Pulmonary:     Effort: Pulmonary effort is normal.     Breath sounds: Normal breath sounds.  Abdominal:     General: There is no distension.     Palpations: There is no mass.  Musculoskeletal:     Right lower leg: No edema.     Left lower leg: No edema.  Lymphadenopathy:     Cervical: No cervical adenopathy.  Neurological:     General: No focal deficit present.     Mental Status: He is alert and oriented  to person, place, and time.  Psychiatric:        Mood and Affect: Mood normal.        Behavior: Behavior normal.        Thought Content: Thought content normal.    LABORATORY DATA:   I have reviewed the data as listed.  Results for orders placed or performed in visit on 04/09/24  CBC with Differential (Cancer Center Only)  Result Value Ref Range   WBC Count 10.0 4.0 - 10.5 K/uL   RBC 4.94 4.22 - 5.81 MIL/uL   Hemoglobin 14.9 13.0 - 17.0 g/dL   HCT 55.4 60.9 - 47.9 %   MCV 90.1 80.0 - 100.0 fL   MCH 30.2 26.0 - 34.0 pg   MCHC 33.5 30.0 - 36.0 g/dL   RDW 84.9 88.4 - 84.4 %   Platelet Count 203 150 - 400 K/uL   nRBC 0.0 0.0 - 0.2 %   Neutrophils Relative % 50 %   Neutro Abs 4.9 1.7 - 7.7 K/uL   Lymphocytes Relative 35 %   Lymphs Abs 3.5 0.7 - 4.0 K/uL   Monocytes Relative 8 %   Monocytes Absolute 0.8 0.1 - 1.0 K/uL   Eosinophils Relative 6 %   Eosinophils Absolute 0.6 (H) 0.0 - 0.5 K/uL   Basophils Relative 1 %   Basophils Absolute 0.1 0.0 - 0.1 K/uL   Immature Granulocytes 0 %   Abs Immature Granulocytes 0.03 0.00 - 0.07 K/uL    RADIOGRAPHIC STUDIES:   CT Chest Wo Contrast CLINICAL DATA:  Follow-up pulmonary nodule left upper lobe on chest CT 01/29/2024.  EXAM: CT CHEST WITHOUT CONTRAST  TECHNIQUE: Multidetector CT imaging of the chest was performed following the standard protocol without IV contrast.  RADIATION DOSE REDUCTION: This exam was performed according to the departmental dose-optimization program which includes automated exposure control, adjustment of the mA and/or  kV according to patient size and/or use of iterative reconstruction technique.  COMPARISON:  Lung cancer screening chest CT 06/10/2023; CT chest 01/29/2024  FINDINGS: Cardiovascular: Normal heart size. No pericardial effusion. Coronary arterial vascular calcifications. Prominent pulmonary artery measuring 3.3 cm.  Mediastinum/Nodes: No enlarged axillary, mediastinal or  hilar lymphadenopathy. No axillary lymphadenopathy.  Lungs/Pleura: Central airways are patent. Emphysematous change, upper lobe predominant. Stable 7 mm left upper lobe pulmonary nodule (image 23; series 3). There is a new 4 mm left lower lobe pulmonary nodule (image 133; series 3). There is a new 4 mm right lower lobe nodule (image 124; series 3). New 5 mm right lower lobe nodule (image 102; series 3). New 6 mm right upper lobe nodule (image 69; series 3). New 5 mm right upper lobe nodule (image 45; series 3). No pleural effusion or pneumothorax.  Upper Abdomen: No acute abnormality. Stable small right hepatic lobe cyst.  Musculoskeletal: No chest wall mass or suspicious bone lesions identified.  IMPRESSION: 1. Stable 7 mm left upper lobe pulmonary nodule. 2. There are multiple new pulmonary nodules measuring up to 6 mm. Given the multiple new pulmonary nodules measuring up to 6 mm, recommend follow-up chest CT in 3-6 months for re-evaluation. 3. Prominent main pulmonary artery compatible pulmonary arterial hypertension.  Aortic Atherosclerosis (ICD10-I70.0) and Emphysema (ICD10-J43.9).  Electronically Signed   By: Bard Moats M.D.   On: 04/05/2024 14:45    No orders of the defined types were placed in this encounter.    No future appointments.   This document was completed utilizing speech recognition software. Grammatical errors, random word insertions, pronoun errors, and incomplete sentences are an occasional consequence of this system due to software limitations, ambient noise, and hardware issues. Any formal questions or concerns about the content, text or information contained within the body of this dictation should be directly addressed to the provider for clarification.

## 2024-05-20 ENCOUNTER — Ambulatory Visit: Payer: Self-pay | Admitting: Pulmonary Disease

## 2024-05-20 ENCOUNTER — Other Ambulatory Visit: Payer: Self-pay | Admitting: Pulmonary Disease

## 2024-05-20 DIAGNOSIS — R918 Other nonspecific abnormal finding of lung field: Secondary | ICD-10-CM

## 2024-05-24 ENCOUNTER — Other Ambulatory Visit: Payer: Self-pay | Admitting: Pulmonary Disease

## 2024-06-16 ENCOUNTER — Ambulatory Visit: Admitting: Pulmonary Disease

## 2024-06-16 ENCOUNTER — Encounter: Payer: Self-pay | Admitting: Pulmonary Disease

## 2024-06-16 VITALS — BP 117/82 | HR 118 | Ht 74.0 in | Wt 214.6 lb

## 2024-06-16 DIAGNOSIS — R918 Other nonspecific abnormal finding of lung field: Secondary | ICD-10-CM | POA: Diagnosis not present

## 2024-06-16 DIAGNOSIS — R0609 Other forms of dyspnea: Secondary | ICD-10-CM

## 2024-06-16 DIAGNOSIS — Z87891 Personal history of nicotine dependence: Secondary | ICD-10-CM | POA: Diagnosis not present

## 2024-06-16 DIAGNOSIS — R911 Solitary pulmonary nodule: Secondary | ICD-10-CM

## 2024-06-16 DIAGNOSIS — J439 Emphysema, unspecified: Secondary | ICD-10-CM | POA: Diagnosis not present

## 2024-06-16 NOTE — Patient Instructions (Signed)
 Will follow-up on your CT scan that scheduled for November  As long as no new spots are mentioned, we will likely will need to repeat the CAT scan again in about 6 months  Continue to stay active and healthy  Use your rescue inhaler as needed  Call us  with significant concerns  Make sure you continue to stay off cigarettes  Follow-up in 6 months

## 2024-06-16 NOTE — Progress Notes (Signed)
 Timothy EMIG Sr.    [id number]    [birth year]  Primary Care Physician:Morrison, Bernardino MATSU, MD  Referring Physician: Jesus Bernardino MATSU, MD [address] [place],  [place] [address]  Chief complaint:   Follow-up for COPD, lung nodule  HPI: Patient being followed up for lung nodule  He had a repeat CT showing multiple lung nodules The previous nodule that was been followed has remained stable  Denies any other ongoing symptoms   Does have some shortness of breath on exertion Uses albuterol  as needed  Occasional cough  No chest pains or chest discomfort  Trelegy 100 did help symptoms  Outpatient Encounter Medications as of 06/16/2024  Medication Sig   albuterol  (VENTOLIN  HFA) 108 (90 Base) MCG/ACT inhaler Inhale 2 puffs into the lungs every 6 (six) hours as needed for wheezing or shortness of breath.   dicyclomine  (BENTYL ) 20 MG tablet Take 1 tablet (20 mg total) by mouth 2 (two) times daily.   ibuprofen (ADVIL) 800 MG tablet Take 800 mg by mouth every 6 (six) hours as needed.   nitroGLYCERIN  (NITROSTAT ) 0.4 MG SL tablet Place 1 tablet (0.4 mg total) under the tongue every 5 (five) minutes as needed for chest pain (SHORTNESS OF BREATH). GO TO ER IF repeated doses x3  don't control   ondansetron  (ZOFRAN -ODT) 4 MG disintegrating tablet Take 1 tablet (4 mg total) by mouth every 8 (eight) hours as needed.   pantoprazole (PROTONIX) 40 MG tablet Take 40 mg by mouth daily. reported   polyethylene glycol (MIRALAX  / GLYCOLAX ) 17 g packet Take 17 g by mouth daily. Reported   rosuvastatin  (CRESTOR ) 20 MG tablet Take 1 tablet (20 mg total) by mouth daily.   TRELEGY ELLIPTA  200-62.5-25 MCG/ACT AEPB INHALE 1 PUFF BY MOUTH DAILY   nicotine  (NICODERM CQ  - DOSED IN MG/24 HOURS) 14 mg/24hr patch RX #1 Weeks 1-6: 14 mg x 1 patch daily. Wear for 24 hours. If you have sleep disturbances, remove at bedtime.   nicotine  (NICODERM CQ  - DOSED IN MG/24 HR) 7 mg/24hr patch RX #2 Weeks 7-8: 7  mg x 1 patch dailyWear for 24 hours. If you have sleep disturbances, remove at bedtime..   nicotine  polacrilex (COMMIT) 2 MG lozenge RX #1 Weeks 1-6: 1 lozenge every 1-2 hours. Use at least 9 lozenges per day for the first 6 weeks. Max 20 lozenges per day.   nicotine  polacrilex (COMMIT) 2 MG lozenge RX #2 Weeks 7-9: 1 lozenge every 2-4 hours.Max 20 lozenges per day.   nicotine  polacrilex (COMMIT) 2 MG lozenge RX #3 Weeks 10-12: 1 lozenge every 4-8 hours.Max 20 lozenges per day.   nicotine  polacrilex (NICORETTE ) 2 MG gum RX #1 Weeks 1-6: 1 piece every 1-2 hours.Use at least 9 pieces of gum per day for the first 6 weeks. Max 24 pieces per day.   nicotine  polacrilex (NICORETTE ) 2 MG gum RX #2 Weeks 7-9: 1 piece every 2-4 hours.Max 24 pieces per day.   nicotine  polacrilex (NICORETTE ) 2 MG gum RX #3 Weeks 10-12: 1 piece every 4-8 hours.Max 24 pieces per day.   No facility-administered encounter medications on file as of 06/16/2024.    Allergies as of 06/16/2024   (No Known Allergies)    Past Medical History:  Diagnosis Date   Anxiety 01/14/08   Depression    DYSFNCT ASSO W/SLEEP STGES/AROUSAL FRM SLEEP 12/24/2007            WEIGHT LOSS, ABNORMAL 11/19/2007  Wt Readings from Last 10 Encounters:      06/01/23    187 lb 12.8 oz (85.2 kg)      05/26/23    190 lb (86.2 kg)      07/06/14    183 lb 6.4 oz (83.2 kg)      06/30/14    184 lb (83.5 kg)                 Past Surgical History:  Procedure Laterality Date   HERNIA REPAIR      Family History  Problem Relation Age of Onset   Cancer Mother        multiple myeloma, has had 2 stem cell transplants.   Cancer Maternal Grandmother    Cancer Maternal Grandfather        lung   COPD Sister    Depression Sister    Diabetes Sister    Early death Sister     Social History   Socioeconomic History   Marital status: Divorced    Spouse name: Not on file   Number of children: Not on file   Years of education: Not on file   Highest  education level: Some college, no degree  Occupational History   Not on file  Tobacco Use   Smoking status: Former    Current packs/day: 0.50    Average packs/day: 0.5 packs/day for 15.0 years (7.5 ttl pk-yrs)    Types: Cigars, Cigarettes   Smokeless tobacco: Not on file   Tobacco comments:    Quit 05/2023    Smoked 28 years     Smoked cigars mainly , 1 pack daily, 5 per pack.    28 cigar pack year smoking history  Vaping Use   Vaping status: Never Used  Substance and Sexual Activity   Alcohol use: Yes    Comment: Social   Drug use: Yes    Frequency: 3.0 times per week    Types: Marijuana   Sexual activity: Yes    Birth control/protection: Condom  Other Topics Concern   Not on file  Social History Narrative   Not on file   Social Drivers of Health   Financial Resource Strain: Low Risk  (09/17/2023)   Overall Financial Resource Strain (CARDIA)    Difficulty of Paying Living Expenses: Not hard at all  Food Insecurity: No Food Insecurity (09/17/2023)   Hunger Vital Sign    Worried About Running Out of Food in the Last Year: Never true    Ran Out of Food in the Last Year: Never true  Transportation Needs: No Transportation Needs (09/17/2023)   PRAPARE - Administrator, Civil Service (Medical): No    Lack of Transportation (Non-Medical): No  Physical Activity: Sufficiently Active (09/17/2023)   Exercise Vital Sign    Days of Exercise per Week: 7 days    Minutes of Exercise per Session: 40 min  Stress: Stress Concern Present (09/17/2023)   [employer] of Occupational Health - Occupational Stress Questionnaire    Feeling of Stress : To some extent  Social Connections: Moderately Isolated (09/17/2023)   Social Connection and Isolation Panel    Frequency of Communication with Friends and Family: More than three times a week    Frequency of Social Gatherings with Friends and Family: Twice a week    Attends Religious Services: More than 4 times per year    Active  Member of [employer] or Organizations: No    Attends Banker Meetings:  Not on file    Marital Status: Divorced  Intimate Partner Violence: Not At Risk (06/29/2023)   Humiliation, Afraid, Rape, and Kick questionnaire    Fear of Current or Ex-Partner: No    Emotionally Abused: No    Physically Abused: No    Sexually Abused: No    Review of Systems  Constitutional:  Negative for fatigue.  Respiratory:  Positive for shortness of breath. Negative for chest tightness.   Gastrointestinal:  Negative for abdominal pain.  Psychiatric/Behavioral:  Negative for sleep disturbance.     Vitals:   06/16/24 1111  BP: 117/82  Pulse: (!) 118  SpO2: 99%     Physical Exam Constitutional:      Appearance: Normal appearance.  HENT:     Head: Normocephalic.     Mouth/Throat:     Mouth: Mucous membranes are moist.  Eyes:     General: No scleral icterus. Cardiovascular:     Rate and Rhythm: Normal rate and regular rhythm.     Heart sounds: No murmur heard.    No friction rub.  Pulmonary:     Effort: No respiratory distress.     Breath sounds: No stridor. No wheezing or rhonchi.  Musculoskeletal:     Cervical back: No rigidity or tenderness.  Neurological:     Mental Status: He is alert.  Psychiatric:        Mood and Affect: Mood normal.      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  Jennet Epley MD [facility] 06/16/2024, 11:13 AM  CC: Jesus Bernardino MATSU, MD

## 2024-07-15 ENCOUNTER — Ambulatory Visit
Admission: RE | Admit: 2024-07-15 | Discharge: 2024-07-15 | Disposition: A | Discharge status: Home / Self Care | Source: Ambulatory Visit | Attending: Pulmonary Disease | Admitting: Pulmonary Disease

## 2024-07-15 DIAGNOSIS — R918 Other nonspecific abnormal finding of lung field: Secondary | ICD-10-CM

## 2024-07-21 ENCOUNTER — Ambulatory Visit: Payer: Self-pay

## 2024-07-21 DIAGNOSIS — R1033 Periumbilical pain: Secondary | ICD-10-CM

## 2024-07-21 NOTE — Telephone Encounter (Signed)
 FYI Only or Action Required?: FYI only for provider: appointment scheduled on 08/05/24.  Patient was last seen in primary care on 09/17/2023 by Jesus Bernardino MATSU, MD.  Called Nurse Triage reporting Abdominal Pain.  Symptoms began several months ago.  Interventions attempted: Nothing.  Symptoms are: stable.  Triage Disposition: See PCP Within 2 Weeks  Patient/caregiver understands and will follow disposition?: Yes   Reason for Disposition  Abdominal pain is a chronic symptom (recurrent or ongoing AND present > 4 weeks)  Answer Assessment - Initial Assessment Questions Havlyn with [facility] transferred patient over to be triaged. Gradually getting worse. Patient already scheduled for 12/16, no sooner appointments available. Denies chest pain or SOB.   1. LOCATION: Where does it hurt?      Right above naval in the center  2. RADIATION: Does the pain shoot anywhere else? (e.g., chest, back)     Just around abdominal area.   3. ONSET: When did the pain begin? (Minutes, hours or days ago)      4 months  4. PATTERN Does the pain come and go, or is it constant?     Hurts when moving, coughing / blowing nose  5. SEVERITY: How bad is the pain?  (e.g., Scale 1-10; mild, moderate, or severe)     7-8/10  6. RECURRENT SYMPTOM: Have you ever had this type of stomach pain before? If Yes, ask: When was the last time? and What happened that time?      Yes had hernia surgery in 1999 in same spot  7. CAUSE: What do you think is causing the stomach pain? (e.g., gallstones, recent abdominal surgery)     Hernia  8. RELIEVING/AGGRAVATING FACTORS: What makes it better or worse? (e.g., antacids, bending or twisting motion, bowel movement)     Worse with certain movements, coughing / blowing nose  9. OTHER SYMPTOMS: Do you have any other symptoms? (e.g., back pain, diarrhea, fever, urination pain, vomiting)       Denies  Protocols used: Abdominal Pain -  Male-A-AH

## 2024-07-21 NOTE — Addendum Note (Signed)
 Addended by: Marva Hendryx G on: 07/21/2024 09:34 PM   Modules accepted: Orders

## 2024-07-22 ENCOUNTER — Other Ambulatory Visit: Payer: Self-pay

## 2024-07-22 DIAGNOSIS — R1033 Periumbilical pain: Secondary | ICD-10-CM

## 2024-07-22 NOTE — Telephone Encounter (Signed)
 Placed in stat chat

## 2024-07-29 ENCOUNTER — Ambulatory Visit: Payer: Self-pay | Admitting: General Surgery

## 2024-08-05 ENCOUNTER — Ambulatory Visit: Admitting: Internal Medicine

## 2024-09-24 ENCOUNTER — Other Ambulatory Visit: Payer: Self-pay | Admitting: Pulmonary Disease

## 2024-09-26 ENCOUNTER — Encounter (HOSPITAL_COMMUNITY): Payer: Self-pay | Admitting: General Surgery

## 2024-09-26 NOTE — Patient Instructions (Addendum)
 SURGICAL WAITING ROOM VISITATION  Patients having surgery or a procedure may have no more than 2 support people in the waiting area - these visitors may rotate.    Children ages 34 and under will not be able to visit patients in [facility] under most circumstances.   Visitors with respiratory illnesses are discouraged from visiting and should remain at home.  If the patient needs to stay at the hospital during part of their recovery, the visitor guidelines for inpatient rooms apply. Pre-op nurse will coordinate an appropriate time for 1 support person to accompany patient in pre-op.  This support person may not rotate.    Please refer to the [facility] website for the visitor guidelines for Inpatients (after your surgery is over and you are in a regular room).       Your procedure is scheduled on: Tuesday, Feb. 17, 2026   Report to [facility] Main Entrance    Report to admitting at 9:00 AM   Call this number if you have problems the morning of surgery [phone]   Do not eat food :After Midnight.   After Midnight you may have the following liquids until 8:00AM DAY OF SURGERY  Water Non-Citrus Juices (without pulp, NO RED-Apple, White grape, White cranberry) Black Coffee (NO MILK/CREAM OR CREAMERS, sugar ok)  Clear Tea (NO MILK/CREAM OR CREAMERS, sugar ok) regular and decaf                             Plain Jell-O (NO RED)                                           Fruit ices (not with fruit pulp, NO RED)                                     Popsicles (NO RED)                                                               Sports drinks like Gatorade (NO RED)  FOLLOW BOWEL PREP AND ANY ADDITIONAL PRE OP INSTRUCTIONS YOU RECEIVED FROM YOUR SURGEON'S OFFICE!!!     Oral Hygiene is also important to reduce your risk of infection.                                    Remember - BRUSH YOUR TEETH THE MORNING OF SURGERY WITH YOUR REGULAR TOOTHPASTE  DENTURES WILL BE  REMOVED PRIOR TO SURGERY PLEASE DO NOT APPLY Poly grip OR ADHESIVES!!!   Do NOT smoke after Midnight   Stop all vitamins and herbal supplements 7 days before surgery.   Take these medicines the morning of surgery with A SIP OF WATER:  Rosuvastatin  if takes in the morning Use Trelegy if normal uses in the morning Bring Rescue Inhaler  You may not have any metal on your body including jewelry, and body piercing             Do not wear lotions, powders, perfumes/cologne, or deodorant             Men may shave face and neck.   Do not bring valuables to the hospital. [facility] IS NOT             RESPONSIBLE   FOR VALUABLES.   Contacts, glasses, dentures or bridgework may not be worn into surgery.   Bring small overnight bag day of surgery.   DO NOT BRING YOUR HOME MEDICATIONS TO THE HOSPITAL. PHARMACY WILL DISPENSE MEDICATIONS LISTED ON YOUR MEDICATION LIST TO YOU DURING YOUR ADMISSION IN THE HOSPITAL!    Patients discharged on the day of surgery will not be allowed to drive home.  Someone NEEDS to stay with you for the first 24 hours after anesthesia.   Special Instructions: Bring a copy of your healthcare power of attorney and living will documents the day of surgery if you haven't scanned them before.              Please read over the following fact sheets you were given: IF YOU HAVE QUESTIONS ABOUT YOUR PRE-OP INSTRUCTIONS PLEASE CALL [phone].   If you received a COVID test during your pre-op visit  it is requested that you wear a mask when out in public, stay away from anyone that may not be feeling well and notify your surgeon if you develop symptoms. If you test positive for Covid or have been in contact with anyone that has tested positive in the last 10 days please notify you surgeon.    [facility] - Preparing for Surgery Before surgery, you can play an important role.  Because skin is not sterile, your skin needs to be as free of germs as  possible.  You can reduce the number of germs on your skin by washing with CHG (chlorahexidine gluconate) soap before surgery.  CHG is an antiseptic cleaner which kills germs and bonds with the skin to continue killing germs even after washing. Please DO NOT use if you have an allergy to CHG or antibacterial soaps.  If your skin becomes reddened/irritated stop using the CHG and inform your nurse when you arrive at Short Stay. Do not shave (including legs and underarms) for at least 48 hours prior to the first CHG shower.  You may shave your face/neck.  Please follow these instructions carefully:  1.  Shower with CHG Soap the night before surgery ONLY (DO NOT USE THE SOAP THE MORNING OF SURGERY).  2.  If you choose to wash your hair, wash your hair first as usual with your normal  shampoo.  3.  After you shampoo, rinse your hair and body thoroughly to remove the shampoo.                             4.  Use CHG as you would any other liquid soap.  You can apply chg directly to the skin and wash.  Gently with a scrungie or clean washcloth.  5.  Apply the CHG Soap to your body ONLY FROM THE NECK DOWN.   Do   not use on face/ open  Wound or open sores. Avoid contact with eyes, ears mouth and   genitals (private parts).                       Wash face,  Genitals (private parts) with your normal soap.             6.  Wash thoroughly, paying special attention to the area where your    surgery  will be performed.  7.  Thoroughly rinse your body with warm water from the neck down.  8.  DO NOT shower/wash with your normal soap after using and rinsing off the CHG Soap.                9.  Pat yourself dry with a clean towel.            10.  Wear clean pajamas.            11.  Place clean sheets on your bed the night of your first shower and do not  sleep with pets. Day of Surgery : Do not apply any CHG, lotions/deodorants the morning of surgery.  Please wear clean clothes to the  hospital/surgery center.  FAILURE TO FOLLOW THESE INSTRUCTIONS MAY RESULT IN THE CANCELLATION OF YOUR SURGERY  PATIENT SIGNATURE_________________________________  NURSE SIGNATURE__________________________________  ________________________________________________________________________

## 2024-09-29 ENCOUNTER — Encounter (HOSPITAL_COMMUNITY): Admission: RE | Admit: 2024-09-29

## 2024-10-07 ENCOUNTER — Ambulatory Visit (HOSPITAL_COMMUNITY): Admit: 2024-10-07 | Admitting: General Surgery

## 2024-10-07 DIAGNOSIS — I2510 Atherosclerotic heart disease of native coronary artery without angina pectoris: Secondary | ICD-10-CM

## 2024-10-07 HISTORY — DX: Atherosclerotic heart disease of native coronary artery without angina pectoris: I25.10

## 2024-10-07 HISTORY — DX: Emphysema, unspecified: J43.9

## 2024-10-07 HISTORY — DX: Secondary pulmonary arterial hypertension: I27.21

## 2024-10-07 SURGERY — REPAIR, HERNIA, VENTRAL, ROBOT-ASSISTED
Anesthesia: General
